# Patient Record
Sex: Female | Born: 1965 | Race: Black or African American | Hispanic: No | Marital: Single | Smoking: Current every day smoker
Health system: Southern US, Community
[De-identification: ages and names within clinical notes are randomized; demographics above are authoritative.]

## PROBLEM LIST (undated history)

## (undated) DIAGNOSIS — K219 Gastro-esophageal reflux disease without esophagitis: Secondary | ICD-10-CM

## (undated) DIAGNOSIS — B1920 Unspecified viral hepatitis C without hepatic coma: Secondary | ICD-10-CM

## (undated) DIAGNOSIS — K5900 Constipation, unspecified: Secondary | ICD-10-CM

## (undated) DIAGNOSIS — F329 Major depressive disorder, single episode, unspecified: Secondary | ICD-10-CM

## (undated) DIAGNOSIS — F32A Depression, unspecified: Secondary | ICD-10-CM

## (undated) DIAGNOSIS — Z720 Tobacco use: Secondary | ICD-10-CM

## (undated) DIAGNOSIS — M5430 Sciatica, unspecified side: Secondary | ICD-10-CM

## (undated) DIAGNOSIS — D509 Iron deficiency anemia, unspecified: Secondary | ICD-10-CM

## (undated) DIAGNOSIS — L209 Atopic dermatitis, unspecified: Secondary | ICD-10-CM

## (undated) HISTORY — DX: Major depressive disorder, single episode, unspecified: F32.9

## (undated) HISTORY — DX: Tobacco use: Z72.0

## (undated) HISTORY — DX: Depression, unspecified: F32.A

## (undated) HISTORY — DX: Iron deficiency anemia, unspecified: D50.9

## (undated) HISTORY — DX: Unspecified viral hepatitis C without hepatic coma: B19.20

## (undated) HISTORY — DX: Constipation, unspecified: K59.00

## (undated) HISTORY — DX: Gastro-esophageal reflux disease without esophagitis: K21.9

## (undated) HISTORY — DX: Sciatica, unspecified side: M54.30

## (undated) HISTORY — DX: Atopic dermatitis, unspecified: L20.9

---

## 2002-05-22 HISTORY — PX: OTHER SURGICAL HISTORY: SHX169

## 2008-01-09 ENCOUNTER — Ambulatory Visit: Payer: Self-pay | Admitting: Certified Nurse Midwife

## 2008-01-12 ENCOUNTER — Ambulatory Visit: Payer: Self-pay | Admitting: Certified Nurse Midwife

## 2008-05-03 ENCOUNTER — Emergency Department: Payer: Self-pay | Admitting: Emergency Medicine

## 2009-04-21 ENCOUNTER — Emergency Department: Payer: Self-pay | Admitting: Emergency Medicine

## 2009-10-28 ENCOUNTER — Other Ambulatory Visit: Payer: Self-pay | Admitting: Internal Medicine

## 2009-11-09 IMAGING — US ABDOMEN ULTRASOUND
1 series · 17 of 25 positions shown · non-contrast
Comparison: none

REASON FOR EXAM: abdominal pelvic pain
COMMENTS:

[Series 1: abdomen ultrasound · 17 of 57 slices shown]
[im 1/57]
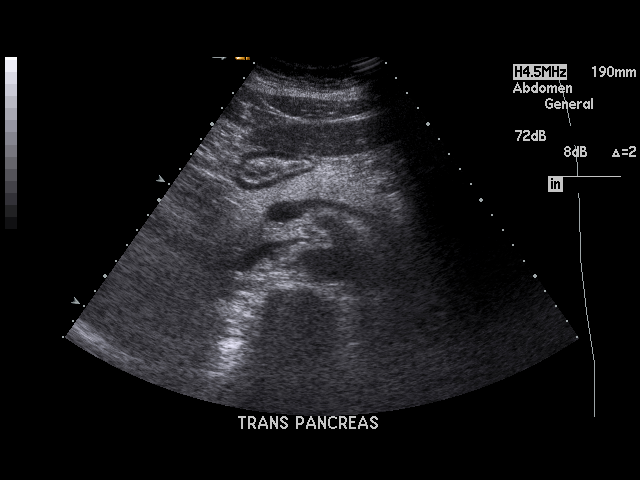
[im 5/57]
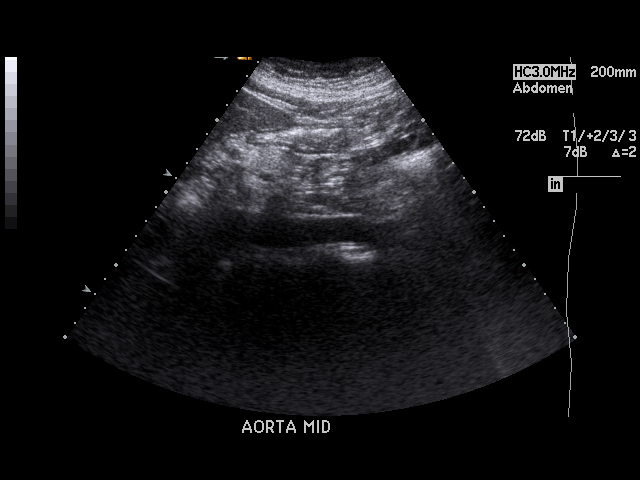
[im 8/57]
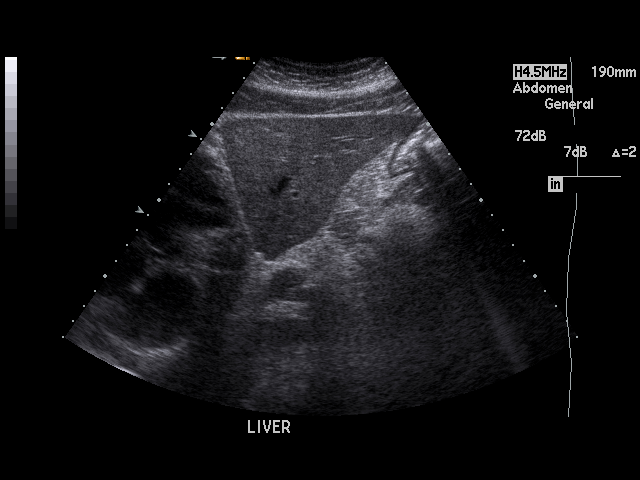
[im 12/57]
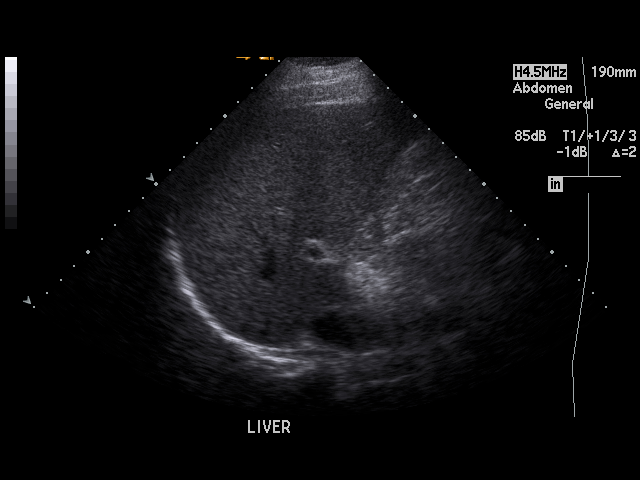
[im 15/57]
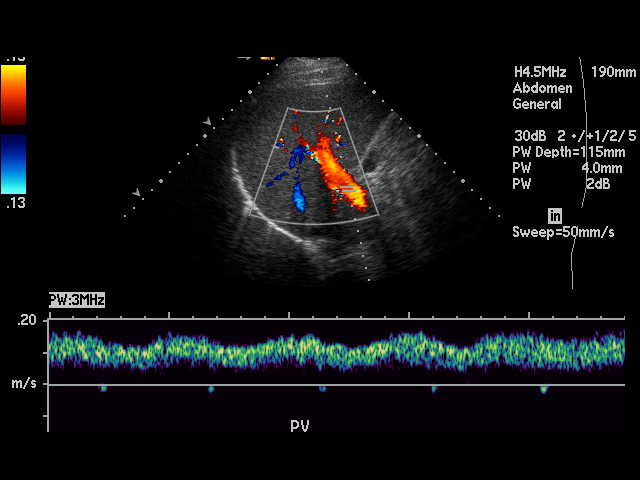
[im 19/57]
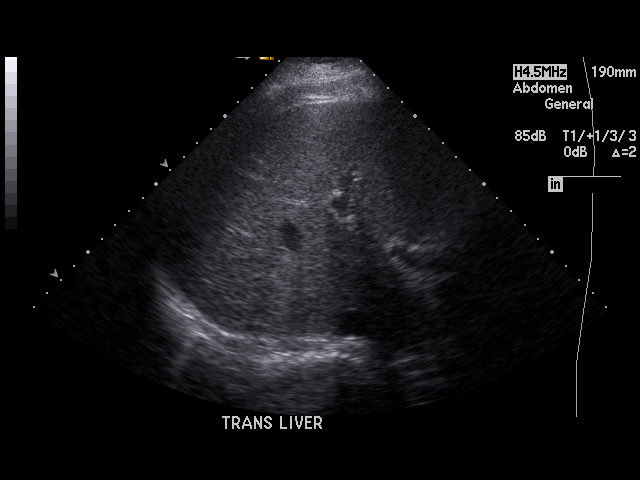
[im 22/57]
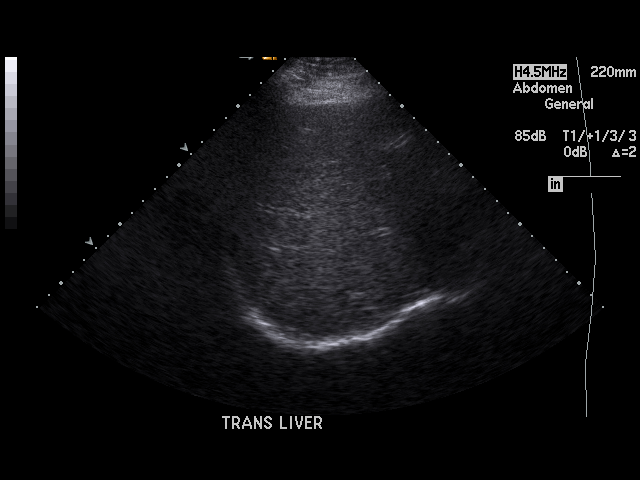
[im 26/57]
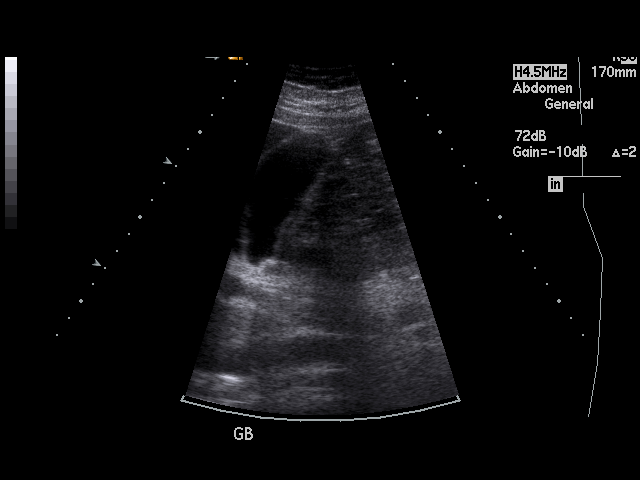
[im 29/57]
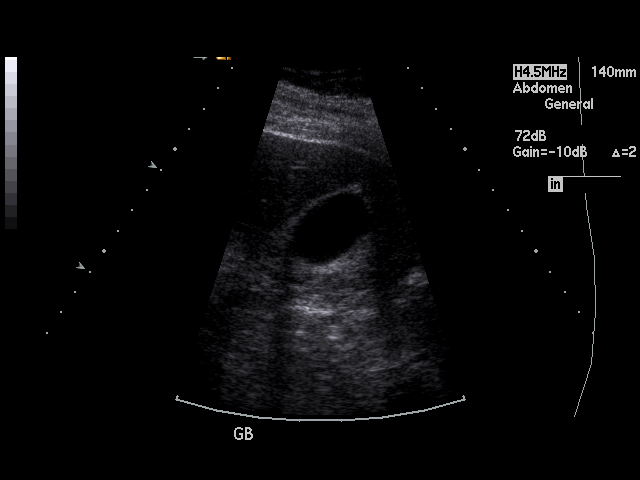
[im 31/57]
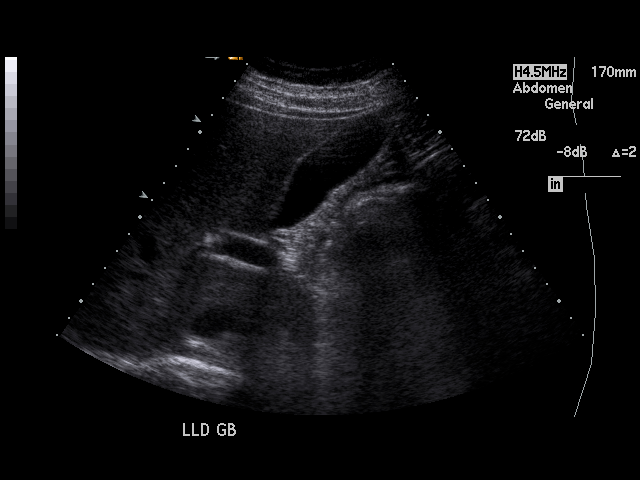
[im 36/57]
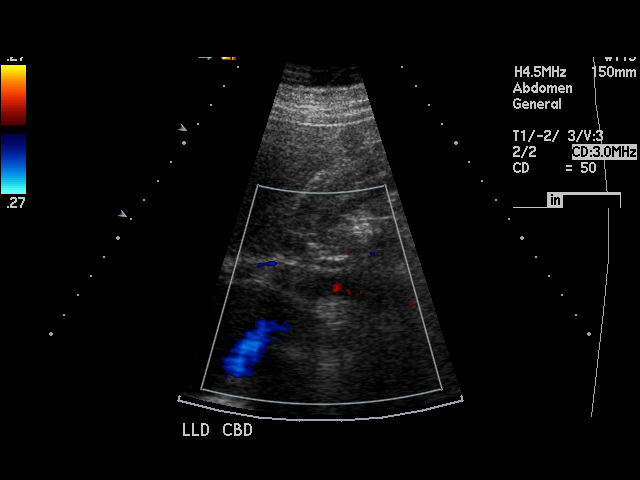
[im 38/57]
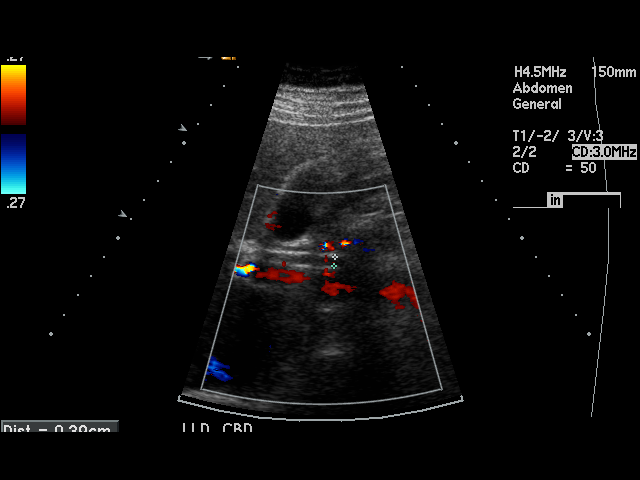
[im 43/57]
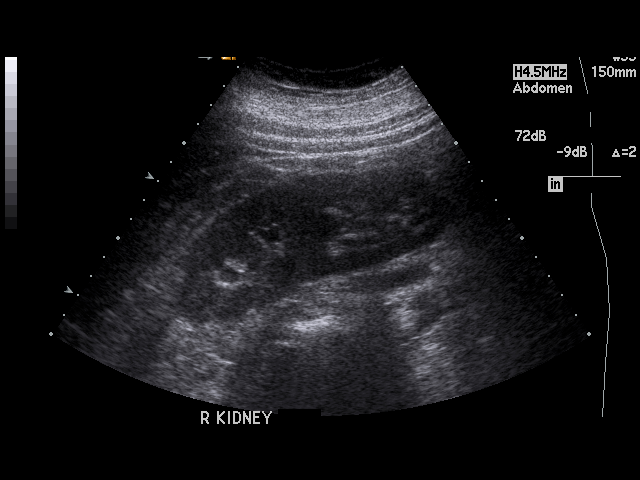
[im 45/57]
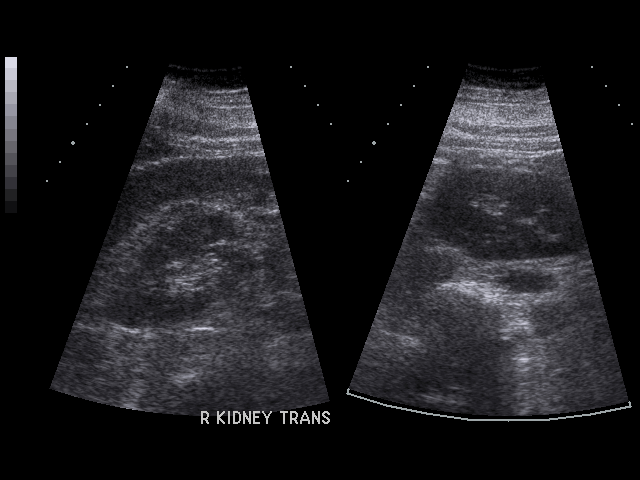
[im 50/57]
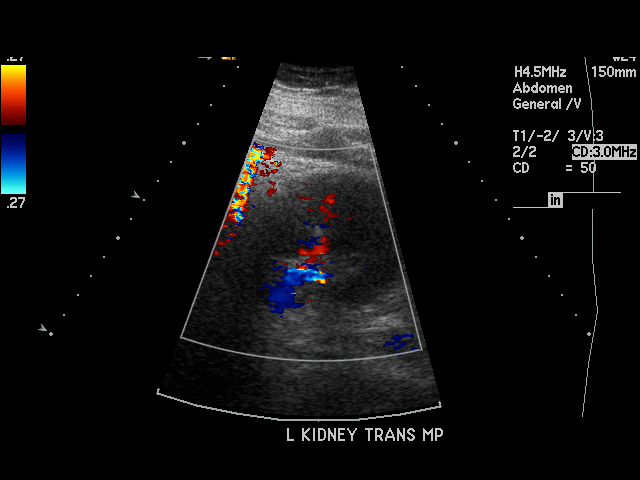
[im 52/57]
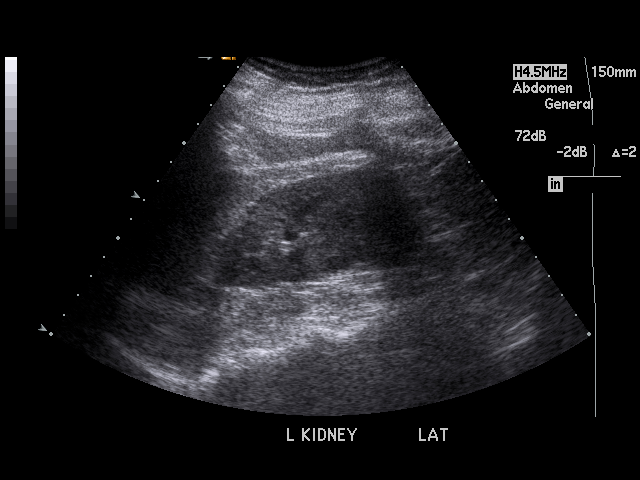
[im 57/57]
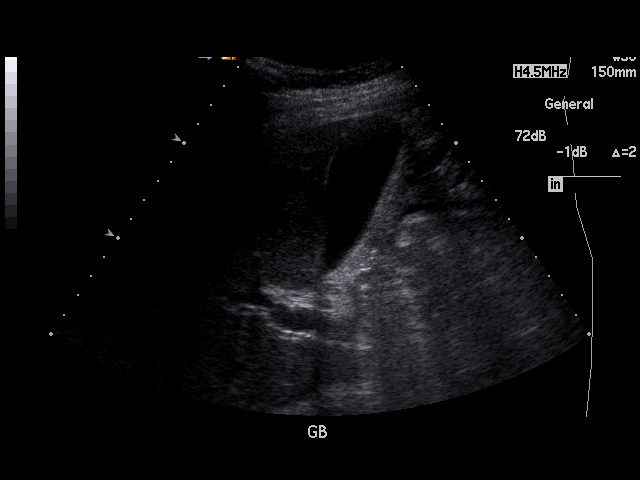

[17 of 25 positions shown; findings below may reference images not displayed]

PROCEDURE:     US  - US ABDOMEN GENERAL SURVEY  - January 12, 2008 [DATE]

RESULT:     The liver exhibits normal echotexture with no evidence of mass
or ductal dilation. Portal venous flow is normal in direction. The
gallbladder is adequately distended with no evidence of stones, wall
thickening, or pericholecystic fluid. The common bile duct is normal at
mm in diameter. There is limited evaluation of the pancreatic tail but
otherwise the pancreas appeared normal. The spleen, abdominal aorta, and
kidneys are normal in appearance. There is no evidence of ascites.
IMPRESSION: There is limited evaluation of the pancreatic tail.
Otherwise, the study is within the limits of normal.

## 2010-08-26 ENCOUNTER — Ambulatory Visit: Payer: Self-pay | Admitting: Family Medicine

## 2010-12-08 ENCOUNTER — Emergency Department: Payer: Self-pay | Admitting: Unknown Physician Specialty

## 2012-03-16 ENCOUNTER — Emergency Department: Payer: Self-pay | Admitting: Emergency Medicine

## 2014-02-28 ENCOUNTER — Emergency Department: Payer: Self-pay | Admitting: Emergency Medicine

## 2014-02-28 LAB — COMPREHENSIVE METABOLIC PANEL
ALBUMIN: 2.9 g/dL — AB (ref 3.4–5.0)
Alkaline Phosphatase: 94 U/L
Anion Gap: 9 (ref 7–16)
BUN: 13 mg/dL (ref 7–18)
Bilirubin,Total: 0.5 mg/dL (ref 0.2–1.0)
CHLORIDE: 111 mmol/L — AB (ref 98–107)
CO2: 21 mmol/L (ref 21–32)
CREATININE: 1.19 mg/dL (ref 0.60–1.30)
Calcium, Total: 9 mg/dL (ref 8.5–10.1)
EGFR (African American): >60
EGFR (Non-African Amer.): 54 — ABNORMAL LOW
Glucose: 148 mg/dL — ABNORMAL HIGH (ref 65–99)
Osmolality: 284 (ref 275–301)
Potassium: 3.7 mmol/L (ref 3.5–5.1)
SGOT(AST): 73 U/L — ABNORMAL HIGH (ref 15–37)
SGPT (ALT): 87 U/L — ABNORMAL HIGH
Sodium: 141 mmol/L (ref 136–145)
Total Protein: 7.9 g/dL (ref 6.4–8.2)

## 2014-02-28 LAB — CBC WITH DIFFERENTIAL/PLATELET
BASOS ABS: 0.1 10*3/uL (ref 0.0–0.1)
Basophil %: 0.8 %
EOS PCT: 1.4 %
Eosinophil #: 0.1 10*3/uL (ref 0.0–0.7)
HCT: 44.2 % (ref 35.0–47.0)
HGB: 14.3 g/dL (ref 12.0–16.0)
LYMPHS ABS: 1.8 10*3/uL (ref 1.0–3.6)
LYMPHS PCT: 26.2 %
MCH: 34.5 pg — AB (ref 26.0–34.0)
MCHC: 32.2 g/dL (ref 32.0–36.0)
MCV: 107 fL — ABNORMAL HIGH (ref 80–100)
MONO ABS: 0.3 x10 3/mm (ref 0.2–0.9)
Monocyte %: 4.7 %
NEUTROS ABS: 4.5 10*3/uL (ref 1.4–6.5)
Neutrophil %: 66.9 %
PLATELETS: 266 10*3/uL (ref 150–440)
RBC: 4.13 10*6/uL (ref 3.80–5.20)
RDW: 13.9 % (ref 11.5–14.5)
WBC: 6.7 10*3/uL (ref 3.6–11.0)

## 2014-02-28 LAB — HCG, QUANTITATIVE, PREGNANCY

## 2014-02-28 LAB — URINALYSIS, COMPLETE
BACTERIA: NONE SEEN
Bilirubin,UR: NEGATIVE
Blood: NEGATIVE
GLUCOSE, UR: NEGATIVE mg/dL (ref 0–75)
Hyaline Cast: 1
Ketone: NEGATIVE
Nitrite: NEGATIVE
PROTEIN: NEGATIVE
Ph: 5 (ref 4.5–8.0)
RBC,UR: 4 /HPF (ref 0–5)
SPECIFIC GRAVITY: 1.024 (ref 1.003–1.030)

## 2014-02-28 LAB — TROPONIN I

## 2014-02-28 LAB — LIPASE, BLOOD: Lipase: 379 U/L (ref 73–393)

## 2016-10-05 ENCOUNTER — Emergency Department
Admission: EM | Admit: 2016-10-05 | Discharge: 2016-10-05 | Disposition: A | Discharge status: Home / Self Care | Payer: Self-pay | Attending: Emergency Medicine | Admitting: Emergency Medicine

## 2016-10-05 DIAGNOSIS — Y929 Unspecified place or not applicable: Secondary | ICD-10-CM | POA: Insufficient documentation

## 2016-10-05 DIAGNOSIS — S91311A Laceration without foreign body, right foot, initial encounter: Secondary | ICD-10-CM | POA: Insufficient documentation

## 2016-10-05 DIAGNOSIS — Z23 Encounter for immunization: Secondary | ICD-10-CM | POA: Insufficient documentation

## 2016-10-05 DIAGNOSIS — W268XXA Contact with other sharp object(s), not elsewhere classified, initial encounter: Secondary | ICD-10-CM | POA: Insufficient documentation

## 2016-10-05 DIAGNOSIS — Y9389 Activity, other specified: Secondary | ICD-10-CM | POA: Insufficient documentation

## 2016-10-05 DIAGNOSIS — F17210 Nicotine dependence, cigarettes, uncomplicated: Secondary | ICD-10-CM | POA: Insufficient documentation

## 2016-10-05 DIAGNOSIS — Y999 Unspecified external cause status: Secondary | ICD-10-CM | POA: Insufficient documentation

## 2016-10-05 MED ORDER — TETANUS-DIPHTH-ACELL PERTUSSIS 5-2.5-18.5 LF-MCG/0.5 IM SUSP
0.5000 mL | Freq: Once | INTRAMUSCULAR | Status: AC
  Administered 2016-10-05: 0.5 mL via INTRAMUSCULAR
  Filled 2016-10-05: qty 0.5

## 2016-10-05 MED ORDER — KETOROLAC TROMETHAMINE 30 MG/ML IJ SOLN
30.0000 mg | Freq: Once | INTRAMUSCULAR | Status: AC
  Administered 2016-10-05: 30 mg via INTRAMUSCULAR

## 2016-10-05 MED ORDER — NAPROXEN 500 MG PO TBEC: 500.0000 mg | DELAYED_RELEASE_TABLET | Freq: Two times a day (BID) | ORAL | 0 refills | Status: AC

## 2016-10-05 MED ORDER — KETOROLAC TROMETHAMINE 30 MG/ML IJ SOLN
INTRAMUSCULAR | Status: AC
  Administered 2016-10-05: 30 mg via INTRAMUSCULAR
  Filled 2016-10-05: qty 1

## 2016-10-05 NOTE — ED Provider Notes (Signed)
[facility] Emergency Department Provider Note  ____________________________________________  Time seen: Approximately 3:38 PM  I have reviewed the triage vital signs and the nursing notes.   HISTORY  Chief Complaint Laceration    HPI SRITHA CHAUNCEY is a 51 y.o. female presents to the emergency department with a superficial laceration sustained to the plantar aspect of the first digit of the left lower extremity. Patient states that she sustained laceration after stepping on a metal fence posts last night while barefoot. Patient states that laceration bled initially. However, hemostasis has been achieved prior to discharge. Patient denies radiculopathy or weakness. No alleviating measures have attempted besides the application of a clean dressing.   History reviewed. No pertinent past medical history.  There are no active problems to display for this patient.   History reviewed. No pertinent surgical history.  Prior to Admission medications   Medication Sig Start Date End Date Taking? Authorizing Provider  naproxen (EC NAPROSYN) 500 MG EC tablet Take 1 tablet (500 mg total) by mouth 2 (two) times daily with a meal. 10/05/16 10/15/16  Orvil Feil, PA-C    Allergies Codeine  No family history on file.  Social History Social History  Substance Use Topics  . Smoking status: Current Every Day Smoker    Types: Cigarettes  . Smokeless tobacco: Never Used  . Alcohol use No     Review of Systems  Constitutional: No fever/chills Eyes: No visual changes. No discharge ENT: No upper respiratory complaints. Cardiovascular: no chest pain. Respiratory: no cough. No SOB. Gastrointestinal: No abdominal pain.  No nausea, no vomiting.  No diarrhea.  No constipation. Musculoskeletal: Negative for musculoskeletal pain. Skin: Patient has laceration at the plantar aspect of great toe, left.  Neurological: Negative for headaches, focal weakness or  numbness.  ____________________________________________   PHYSICAL EXAM:  VITAL SIGNS: ED Triage Vitals  Enc Vitals Group     BP 10/05/16 0907 (!) 141/99     Pulse Rate 10/05/16 0907 92     Resp 10/05/16 0907 18     Temp 10/05/16 0907 98 F (36.7 C)     Temp Source 10/05/16 0907 Oral     SpO2 10/05/16 0907 98 %     Weight 10/05/16 0908 250 lb (113.4 kg)     Height 10/05/16 0908  (1.753 m)     Head Circumference --      Peak Flow --      Pain Score 10/05/16 0906 10     Pain Loc --      Pain Edu? --      Excl. in GC? --      Constitutional: Alert and oriented. Well appearing and in no acute distress. Eyes: Conjunctivae are normal. PERRL. EOMI. Head: Atraumatic. Neck: No stridor.  No cervical spine tenderness to palpation. Cardiovascular: Normal rate, regular rhythm. Normal S1 and S2.  Good peripheral circulation. Respiratory: Normal respiratory effort without tachypnea or retractions. Lungs CTAB. Good air entry to the bases with no decreased or absent breath sounds. Musculoskeletal: Full range of motion to all extremities. No gross deformities appreciated. Neurologic:  Normal speech and language. No gross focal neurologic deficits are appreciated. Reflexes are 2+ and symmetric in the lower extremities bilaterally. Skin:  Patient had superficial laceration at the plantar aspect of the left great toe. Palpable dorsalis pedis pulse bilaterally and symmetrically. Psychiatric: Mood and affect are normal. Speech and behavior are normal. Patient exhibits appropriate insight and judgement.   ____________________________________________  LABS (all labs ordered are listed, but only abnormal results are displayed)  Labs Reviewed - No data to display ____________________________________________  EKG   ____________________________________________  RADIOLOGY   No results found.  ____________________________________________    PROCEDURES  Procedure(s) performed:     Procedures  LACERATION REPAIR Performed by: Orvil Feil Authorized by: Orvil Feil Consent: Verbal consent obtained. Risks and benefits: risks, benefits and alternatives were discussed Consent given by: patient Patient identity confirmed: provided demographic data Prepped and Draped in normal sterile fashion Wound explored  Laceration Location: Plantar aspect of great toe, left  Laceration Length: 1 cm  No Foreign Bodies seen or palpated  Irrigation method: syringe Amount of cleaning: standard  Skin closure: Dermabond  Patient tolerance: Patient tolerated the procedure well with no immediate complications.    Medications  Tdap (BOOSTRIX) injection 0.5 mL (0.5 mLs Intramuscular Given 10/05/16 1046)  ketorolac (TORADOL) 30 MG/ML injection 30 mg (30 mg Intramuscular Given 10/05/16 1044)     ____________________________________________   INITIAL IMPRESSION / ASSESSMENT AND PLAN / ED COURSE  Pertinent labs & imaging results that were available during my care of the patient were reviewed by me and considered in my medical decision making (see chart for details).  Review of the [place] CSRS was performed in accordance of the [facility] prior to dispensing any controlled drugs.     Assessment and plan: Laceration  Patient presents to the emergency department with a laceration of the skin overlying the plantar aspect of the left great toe. Patient underwent laceration repair with Dermabond. Patient tolerated procedure well. Patient was discharged with naproxen. Vital signs are reassuring aside from hypertension. All patient questions were answered.     ____________________________________________  FINAL CLINICAL IMPRESSION(S) / ED DIAGNOSES  Final diagnoses:  Laceration of right foot, initial encounter      NEW MEDICATIONS STARTED DURING THIS VISIT:  There are no discharge medications for this patient.       This chart was dictated using voice recognition  software/Dragon. Despite best efforts to proofread, errors can occur which can change the meaning. Any change was purely unintentional.    Orvil Feil, PA-C 10/05/16 1642    Rockne Menghini, MD 10/13/16 1902

## 2016-10-05 NOTE — ED Triage Notes (Signed)
Pt states she cut her left great toe on a fence post last night .Marland Kitchen

## 2017-03-11 ENCOUNTER — Emergency Department
Admission: EM | Admit: 2017-03-11 | Discharge: 2017-03-11 | Disposition: A | Discharge status: Home / Self Care | Payer: Self-pay | Attending: Emergency Medicine | Admitting: Emergency Medicine

## 2017-03-11 ENCOUNTER — Encounter: Payer: Self-pay | Admitting: Emergency Medicine

## 2017-03-11 DIAGNOSIS — H1089 Other conjunctivitis: Secondary | ICD-10-CM | POA: Insufficient documentation

## 2017-03-11 DIAGNOSIS — F17210 Nicotine dependence, cigarettes, uncomplicated: Secondary | ICD-10-CM | POA: Insufficient documentation

## 2017-03-11 DIAGNOSIS — H579 Unspecified disorder of eye and adnexa: Secondary | ICD-10-CM | POA: Insufficient documentation

## 2017-03-11 MED ORDER — FLUORESCEIN SODIUM 0.6 MG OP STRP
1.0000 | ORAL_STRIP | Freq: Once | OPHTHALMIC | Status: AC
  Administered 2017-03-11: 1 via OPHTHALMIC
  Filled 2017-03-11: qty 1

## 2017-03-11 MED ORDER — KETOROLAC TROMETHAMINE 0.5 % OP SOLN: 1.0000 [drp] | Freq: Four times a day (QID) | OPHTHALMIC | 0 refills | Status: DC

## 2017-03-11 MED ORDER — TETRACAINE HCL 0.5 % OP SOLN
2.0000 [drp] | Freq: Once | OPHTHALMIC | Status: AC
  Administered 2017-03-11: 2 [drp] via OPHTHALMIC
  Filled 2017-03-11: qty 4

## 2017-03-11 NOTE — ED Provider Notes (Signed)
[facility] Emergency Department Provider Note ____________________________________________  Time seen: 1048  I have reviewed the triage vital signs and the nursing notes.  HISTORY  Chief Complaint  Foreign Body in Eye  History and exam per Pollyann Glen, PA-S Sherrie Sport)  HPI [name] is a 51 y.o. female presents to the ED for evaluation A foreign body sensation to the right eye since last night. Patient describes sh was cleaning a fan, and a piece of debris fluid drop. She describes a sharp foreign body sensation, that she thought the glass. Since that time she's had right eye irritation and foreign body sensation to the area under the upper lid. She denies any vision change, nausea, or headache.   History reviewed. No pertinent past medical history.  There are no active problems to display for this patient.  History reviewed. No pertinent surgical history.  Prior to Admission medications   Medication Sig Start Date End Date Taking? Authorizing Provider  ketorolac (ACULAR) 0.5 % ophthalmic solution Place 1 drop into the right eye 4 (four) times daily. 03/11/17   Delories Mauri, Charlesetta Ivory, PA-C   Allergies Codeine  No family history on file.  Social History Social History  Substance Use Topics  . Smoking status: Current Every Day Smoker    Types: Cigarettes  . Smokeless tobacco: Never Used  . Alcohol use No    Review of Systems  Constitutional: Negative for fever. Eyes: Negative for visual changes. Right eye foreign body sensation as above.  ENT: Negative for sore throat. Gastrointestinal: Negative for abdominal pain, vomiting and diarrhea. Neurological: Negative for headaches, focal weakness or numbness. ____________________________________________  PHYSICAL EXAM:  VITAL SIGNS: ED Triage Vitals  Enc Vitals Group     BP 03/11/17 1031 123/64     Pulse Rate 03/11/17 1031 88     Resp 03/11/17 1031 20     Temp 03/11/17 1031 98.4 F (36.9  C)     Temp Source 03/11/17 1031 Oral     SpO2 03/11/17 1031 98 %     Weight 03/11/17 1031 250 lb (113.4 kg)     Height 03/11/17 1031  (1.753 m)     Head Circumference --      Peak Flow --      Pain Score 03/11/17 1014 8     Pain Loc --      Pain Edu? --      Excl. in GC? --     Constitutional: Alert and oriented. Well appearing and in no distress. Head: Normocephalic and atraumatic. Eyes: Conjunctivae are mildly injected on the right. PERRL. Normal extraocular movements. No fluorescin dye uptake to the right eye. Upper lid is everted and swept with a dampened, sterile swab. A brownish foreign body is removed.  Hematological/Lymphatic/Immunological: No preauricular lymphadenopathy. Cardiovascular: Normal distal pulses. Respiratory: Normal respiratory effort.  Musculoskeletal: Nontender with normal range of motion in all extremities.  Skin:  Skin is warm, dry and intact. No rash noted. ____________________________________________  PROCEDURES  Tetracaine iii gtts OD ____________________________________________  INITIAL IMPRESSION / ASSESSMENT AND PLAN / ED COURSE  Patient with right eye FBS. She is found to have a retained foreign body to the upper lid. She reports resolution of the FBS after exam. She is discharged with a prescription for Acular ophthalmic solution to use PRN. She may also use OTC rewetting drops as needed. She is referred to [facility] for follow-up as needed.  ____________________________________________  FINAL CLINICAL IMPRESSION(S) / ED DIAGNOSES  Final diagnoses:  Sensation of foreign body in eye  Other conjunctivitis of right eye      Lissa Hoard, PA-C 03/11/17 1159    Sharman Cheek, MD 03/11/17 1546

## 2017-03-11 NOTE — Discharge Instructions (Signed)
You have had a retained foreign body removed from your upper lid. Use OTC rewetting or lubricating eye drops for comfort. Consider using the prescription eye drops as needed for redness and irritation. Follow-up with [facility] as needed.

## 2017-03-11 NOTE — ED Triage Notes (Signed)
Says got glass in eye last night

## 2017-03-11 NOTE — ED Notes (Addendum)
See triage note  States she was cleaning a ceiling fan and something fell into right eye   States she had blurred vision last pm but denies any this am   conts to have some pain  Describes pain as burning and itchy

## 2017-03-11 NOTE — ED Notes (Signed)
Pt alert and oriented X4, active, cooperative, pt in NAD. RR even and unlabored, color WNL.  Pt informed to return if any life threatening symptoms occur.  Discharge and followup instructions reviewed.  

## 2018-01-25 ENCOUNTER — Emergency Department: Payer: Self-pay

## 2018-01-25 ENCOUNTER — Other Ambulatory Visit: Payer: Self-pay

## 2018-01-25 ENCOUNTER — Emergency Department
Admission: EM | Admit: 2018-01-25 | Discharge: 2018-01-25 | Disposition: A | Discharge status: Home / Self Care | Payer: Self-pay | Attending: Emergency Medicine | Admitting: Emergency Medicine

## 2018-01-25 ENCOUNTER — Encounter: Payer: Self-pay | Admitting: Emergency Medicine

## 2018-01-25 DIAGNOSIS — F17210 Nicotine dependence, cigarettes, uncomplicated: Secondary | ICD-10-CM | POA: Insufficient documentation

## 2018-01-25 DIAGNOSIS — M5441 Lumbago with sciatica, right side: Secondary | ICD-10-CM | POA: Insufficient documentation

## 2018-01-25 MED ORDER — KETOROLAC TROMETHAMINE 30 MG/ML IJ SOLN
30.0000 mg | Freq: Once | INTRAMUSCULAR | Status: AC
  Administered 2018-01-25: 30 mg via INTRAMUSCULAR
  Filled 2018-01-25: qty 1

## 2018-01-25 MED ORDER — PREDNISONE 10 MG PO TABS: ORAL_TABLET | ORAL | 0 refills | Status: DC

## 2018-01-25 NOTE — ED Provider Notes (Signed)
[facility] Emergency Department Provider Note  ____________________________________________   First MD Initiated Contact with Patient 01/25/18 1235     (approximate)  I have reviewed the triage vital signs and the nursing notes.   HISTORY  Chief Complaint Back Pain  HPI Morgan Hicks is a 52 y.o. female is here with complaint of low back pain for 1 week.  Patient states the pain is now radiating from her low back into her right leg.  She states it stops into the area of her hip.  Patient has continued to ambulate without assistance.  She has been taking Tylenol at home without any relief.  She denies any previous problems with her back.  She also denies any urinary symptoms or history of kidney stones.  Patient denies any incontinence of bowel or bladder or saddle anesthesias.  Patient states she drove herself to the ED.  History reviewed. No pertinent past medical history.  There are no active problems to display for this patient.   History reviewed. No pertinent surgical history.  Prior to Admission medications   Medication Sig Start Date End Date Taking? Authorizing Provider  predniSONE (DELTASONE) 10 MG tablet Take 6 tablets  today, on day 2 take 5 tablets, day 3 take 4 tablets, day 4 take 3 tablets, day 5 take  2 tablets and 1 tablet the last day 01/25/18   Tommi Rumps, PA-C    Allergies Codeine  No family history on file.  Social History Social History   Tobacco Use  . Smoking status: Current Every Day Smoker    Types: Cigarettes  . Smokeless tobacco: Never Used  Substance Use Topics  . Alcohol use: No  . Drug use: Not on file    Review of Systems Constitutional: No fever/chills Cardiovascular: Denies chest pain. Respiratory: Denies shortness of breath. Gastrointestinal: No abdominal pain.  No nausea, no vomiting.  Genitourinary: Negative for dysuria.  Negative for kidney stones. Musculoskeletal: Positive for low back pain.   Positive right leg radiculopathy. Skin: Negative for rash. Neurological: Negative for  focal weakness or numbness. ___________________________________________   PHYSICAL EXAM:  VITAL SIGNS: ED Triage Vitals  Enc Vitals Group     BP      Pulse      Resp      Temp      Temp src      SpO2      Weight      Height      Head Circumference      Peak Flow      Pain Score      Pain Loc      Pain Edu?      Excl. in GC?    Constitutional: Alert and oriented. Well appearing and in no acute distress. Eyes: Conjunctivae are normal.  Head: Atraumatic. Neck: No stridor.   Cardiovascular: Normal rate, regular rhythm. Grossly normal heart sounds.  Good peripheral circulation. Respiratory: Normal respiratory effort.  No retractions. Lungs CTAB. Gastrointestinal: Soft and nontender. No distention. No CVA tenderness. Musculoskeletal:  Neurologic:  Normal speech and language. No gross focal neurologic deficits are appreciated. No gait instability. Skin:  Skin is warm, dry and intact. No rash noted. Psychiatric: Mood and affect are normal. Speech and behavior are normal.  ____________________________________________   LABS (all labs ordered are listed, but only abnormal results are displayed)  Labs Reviewed - No data to display  RADIOLOGY  ED MD interpretation:   Lumbar spine shows degenerative changes.  Official radiology report(s): Dg Lumbar Spine 2-3 Views  Result Date: 01/25/2018 CLINICAL DATA:  52 year old female with a history of low back pain EXAM: LUMBAR SPINE - 2-3 VIEW COMPARISON:  None. FINDINGS: Lumbar Spine: Lumbar vertebral elements maintain normal alignment without evidence of anterolisthesis, retrolisthesis, subluxation. No acute fracture line identified. Vertebral body heights maintained. Disc space narrowing at L4-L5 and L5-S1. Endplate changes are most pronounced at these levels with sclerosis. Facet hypertrophy at L4-L5 and L5-S1. Pelvic phleboliths IMPRESSION: No  plain film evidence of acute fracture malalignment of the lumbar spine. Degenerative disc disease and facet disease at L4-L5 and L5-S1 Electronically Signed   By: Gilmer MorJaime  Wagner D.O.   On: 01/25/2018 13:30    ____________________________________________   PROCEDURES  Procedure(s) performed: None  Procedures  Critical Care performed: No  ____________________________________________   INITIAL IMPRESSION / ASSESSMENT AND PLAN / ED COURSE  As part of my medical decision making, I reviewed the following data within the electronic MEDICAL RECORD NUMBER Notes from prior ED visits and [place] Controlled Substance Database  Patient was given Toradol 30 mg IM while waiting for x-ray and results.  LS-spine x-rays were reviewed with patient and she was reassured that there were degenerative changes which most likely is causing her sciatic type presentation.  Patient will begin on prednisone 60 mg 6-day taper.  She is to follow-up with her primary care provider if any continued problems in 1 to 2 weeks.  She may also take Tylenol if needed for pain while taking the prednisone.  ____________________________________________   FINAL CLINICAL IMPRESSION(S) / ED DIAGNOSES  Final diagnoses:  Acute right-sided low back pain with right-sided sciatica     ED Discharge Orders        Ordered    predniSONE (DELTASONE) 10 MG tablet     01/25/18 1346       Note:  This document was prepared using Dragon voice recognition software and may include unintentional dictation errors.    Tommi RumpsSummers, Olivier Frayre L, PA-C 01/25/18 1442    Merrily Brittleifenbark, Neil, MD 01/25/18 1524

## 2018-01-25 NOTE — Discharge Instructions (Addendum)
Begin taking prednisone as directed.  Today you will take 6 tablets and taper down by 1 tablet each day as directed.  Take tablets approximately the same time each day.  Follow-up with your primary care provider.  Call make an appointment for follow-up in 1 to 2 weeks.  You may also take Tylenol while taking the prednisone.  After finishing the prednisone take anti-inflammatory such as Aleve or ibuprofen.

## 2018-01-25 NOTE — ED Triage Notes (Signed)
Presents with lower back pain for 1 week  Stats pain is moving into right leg  Ambulates well   No limp note  Denies any injury or urinary sxs;

## 2018-02-24 ENCOUNTER — Other Ambulatory Visit: Payer: Self-pay

## 2018-02-24 ENCOUNTER — Inpatient Hospital Stay: Payer: Self-pay

## 2018-02-24 ENCOUNTER — Encounter: Payer: Self-pay | Admitting: Internal Medicine

## 2018-02-24 ENCOUNTER — Inpatient Hospital Stay: Payer: Self-pay | Attending: Internal Medicine | Admitting: Internal Medicine

## 2018-02-24 VITALS — BP 158/70 | HR 101 | Temp 97.6°F | Resp 18 | Ht 69.0 in | Wt 241.0 lb

## 2018-02-24 DIAGNOSIS — D509 Iron deficiency anemia, unspecified: Secondary | ICD-10-CM

## 2018-02-24 DIAGNOSIS — M48061 Spinal stenosis, lumbar region without neurogenic claudication: Secondary | ICD-10-CM | POA: Insufficient documentation

## 2018-02-24 DIAGNOSIS — R0602 Shortness of breath: Secondary | ICD-10-CM | POA: Insufficient documentation

## 2018-02-24 DIAGNOSIS — B1920 Unspecified viral hepatitis C without hepatic coma: Secondary | ICD-10-CM | POA: Insufficient documentation

## 2018-02-24 DIAGNOSIS — D649 Anemia, unspecified: Secondary | ICD-10-CM

## 2018-02-24 DIAGNOSIS — K219 Gastro-esophageal reflux disease without esophagitis: Secondary | ICD-10-CM | POA: Insufficient documentation

## 2018-02-24 DIAGNOSIS — F329 Major depressive disorder, single episode, unspecified: Secondary | ICD-10-CM | POA: Insufficient documentation

## 2018-02-24 DIAGNOSIS — M5136 Other intervertebral disc degeneration, lumbar region: Secondary | ICD-10-CM | POA: Insufficient documentation

## 2018-02-24 DIAGNOSIS — F17210 Nicotine dependence, cigarettes, uncomplicated: Secondary | ICD-10-CM | POA: Insufficient documentation

## 2018-02-24 DIAGNOSIS — Z79899 Other long term (current) drug therapy: Secondary | ICD-10-CM | POA: Insufficient documentation

## 2018-02-24 LAB — C-REACTIVE PROTEIN: CRP: <0.8 mg/dL (ref <1.0)

## 2018-02-24 LAB — TECHNOLOGIST SMEAR REVIEW

## 2018-02-24 LAB — CBC WITH DIFFERENTIAL/PLATELET
BASOS PCT: 1 %
Basophils Absolute: 0.1 10*3/uL (ref 0–0.1)
EOS PCT: 4 %
Eosinophils Absolute: 0.2 10*3/uL (ref 0–0.7)
HCT: 27.8 % — ABNORMAL LOW (ref 35.0–47.0)
HEMOGLOBIN: 8.3 g/dL — AB (ref 12.0–16.0)
LYMPHS ABS: 1.8 10*3/uL (ref 1.0–3.6)
Lymphocytes Relative: 31 %
MCH: 25.9 pg — AB (ref 26.0–34.0)
MCHC: 29.7 g/dL — AB (ref 32.0–36.0)
MCV: 87 fL (ref 80.0–100.0)
MONO ABS: 0.3 10*3/uL (ref 0.2–0.9)
Monocytes Relative: 6 %
NEUTROS ABS: 3.3 10*3/uL (ref 1.4–6.5)
Neutrophils Relative %: 58 %
Platelets: 266 10*3/uL (ref 150–440)
RBC: 3.19 MIL/uL — ABNORMAL LOW (ref 3.80–5.20)
RDW: 21.7 % — ABNORMAL HIGH (ref 11.5–14.5)
WBC: 5.7 10*3/uL (ref 4.0–10.5)

## 2018-02-24 LAB — IRON AND TIBC
Iron: 439 ug/dL — ABNORMAL HIGH (ref 28–170)
SATURATION RATIOS: 91 % — AB (ref 10.4–31.8)
TIBC: 482 ug/dL — AB (ref 250–450)
UIBC: 43 ug/dL

## 2018-02-24 LAB — LACTATE DEHYDROGENASE: LDH: 172 U/L (ref 98–192)

## 2018-02-24 LAB — RETICULOCYTES
RBC.: 3.16 MIL/uL — AB (ref 3.80–5.20)
RETIC COUNT ABSOLUTE: 211.7 10*3/uL — AB (ref 19.0–183.0)
Retic Ct Pct: 6.7 % — ABNORMAL HIGH (ref 0.4–3.1)

## 2018-02-24 NOTE — Assessment & Plan Note (Addendum)
#  Anemia hemoglobin 7; MCV 80.  Prior hemoglobin in 2017 within normal limits.  #Etiology of anemia is unclear recommend CBC C-reactive protein haptoglobin iron studies; stool occult blood x2 LDH; myeloma panel kappa lambda light chain.  CRP.  Review of peripheral smear.  #Bilateral axillary adenopathy-we will recommend mammogram at next visit.  #Shortness of breath and exertion/smoker-.  Chest x-ray  #Smoker discussed regarding smoking cessation.   #Follow-up in 1 to 2 weeks to review the above work up.  Thank you Dr.Patel  for allowing me to participate in the care of your pleasant patient. Please do not hesitate to contact me with questions or concerns in the interim.

## 2018-02-24 NOTE — Progress Notes (Signed)
[facility] CONSULT NOTE  Patient Care Team: Hillery Aldo, MD as PCP - General (Family Medicine)  CHIEF COMPLAINTS/PURPOSE OF CONSULTATION:  Anemia  # AUG 2019-anemia MCV 80; electrophoresis-normal/PCP; ferritin 15; creat 0.88; LFTs-N  #Hepatitis C positive [HCV >11]/ B-12/ folate/   No history exists.     HISTORY OF PRESENTING ILLNESS:  Morgan Hicks 52 y.o.  female with no significant past medical history except for untreated hep C has been referred to [place] for new diagnosis of microcytic anemia.  Patient had a recent work-up with her PCP that showed hemoglobin of 7; MCV 80; hepatitis C titers positive; HIV negative; hepatitis B screen negative.  LFTs within normal limits.  Patient complains of mild to moderate fatigue.  Denies any blood in stools black or stools denies blood in urine.  Denies any vaginal bleeding.  Patient had remote history of anemia as a teenager.  Patient never had colonoscopy.  Patient has chronic back pain/myalgias.  10 pounds weight loss in the last 3 months.  No cough.  Mild shortness of breath on exertion.  Review of Systems  Constitutional: Positive for malaise/fatigue and weight loss. Negative for chills, diaphoresis and fever.  HENT: Negative for nosebleeds and sore throat.   Eyes: Negative for double vision.  Respiratory: Positive for shortness of breath. Negative for cough, hemoptysis, sputum production and wheezing.   Cardiovascular: Negative for chest pain, palpitations, orthopnea and leg swelling.  Gastrointestinal: Negative for abdominal pain, blood in stool, constipation, diarrhea, heartburn, melena, nausea and vomiting.  Genitourinary: Negative for dysuria, frequency and urgency.  Musculoskeletal: Positive for back pain and myalgias. Negative for joint pain.  Skin: Negative.  Negative for itching and rash.  Neurological: Negative for dizziness, tingling, focal weakness, weakness and headaches.  Endo/Heme/Allergies: Does not  bruise/bleed easily.  Psychiatric/Behavioral: Negative for depression. The patient is not nervous/anxious and does not have insomnia.      MEDICAL HISTORY:  Past Medical History:  Diagnosis Date  . Atopic dermatitis   . Constipation   . Depression   . GERD (gastroesophageal reflux disease)   . Hepatitis C   . Sciatica   . Tobacco use     SURGICAL HISTORY: Past Surgical History:  Procedure Laterality Date  . paraguard IUD surgery  05/22/2002    SOCIAL HISTORY: Social History   Socioeconomic History  . Marital status: Single    Spouse name: Not on file  . Number of children: Not on file  . Years of education: Not on file  . Highest education level: Not on file  Occupational History  . Not on file  Social Needs  . Financial resource strain: Not on file  . Food insecurity:    Worry: Not on file    Inability: Not on file  . Transportation needs:    Medical: Not on file    Non-medical: Not on file  Tobacco Use  . Smoking status: Current Every Day Smoker    Years: 10.00    Types: Cigarettes  . Smokeless tobacco: Never Used  . Tobacco comment: 5 cigerattes per day  Substance and Sexual Activity  . Alcohol use: No  . Drug use: Not on file  . Sexual activity: Not on file  Lifestyle  . Physical activity:    Days per week: Not on file    Minutes per session: Not on file  . Stress: Not on file  Relationships  . Social connections:    Talks on phone: Not on file  Gets together: Not on file    Attends religious service: Not on file    Active member of club or organization: Not on file    Attends meetings of clubs or organizations: Not on file    Relationship status: Not on file  . Intimate partner violence:    Fear of current or ex partner: Not on file    Emotionally abused: Not on file    Physically abused: Not on file    Forced sexual activity: Not on file  Other Topics Concern  . Not on file  Social History Narrative   In health care; alochol/beer every  other day; smoke 5 cig/day; in [place]    FAMILY HISTORY: Family History  Problem Relation Age of Onset  . Stomach cancer Mother 21  . Cirrhosis Father   . Breast cancer Sister 27  . Throat cancer Other     ALLERGIES:  is allergic to codeine and percocet [oxycodone-acetaminophen].  MEDICATIONS:  Current Outpatient Medications  Medication Sig Dispense Refill  . Alum & Mag Hydroxide-Simeth (MAALOX ADVANCED PO) Take 1 tablet by mouth 3 times/day as needed-between meals & bedtime (gi distress).     . cyclobenzaprine (FLEXERIL) 10 MG tablet Take 10 mg by mouth at bedtime.    . ferrous sulfate 325 (65 FE) MG tablet Take 325 mg by mouth 3 (three) times daily with meals.    . naproxen (NAPROSYN) 500 MG tablet Take 500 mg by mouth daily.     No current facility-administered medications for this visit.       Marland Kitchen  PHYSICAL EXAMINATION: ECOG PERFORMANCE STATUS: 1 - Symptomatic but completely ambulatory  Vitals:   02/24/18 1119  BP: (!) 158/70  Pulse: (!) 101  Resp: 18  Temp: 97.6 F (36.4 C)   Filed Weights   02/24/18 1119  Weight: 241 lb (109.3 kg)    Physical Exam  Constitutional: She is oriented to person, place, and time and well-developed, well-nourished, and in no distress.  HENT:  Head: Normocephalic and atraumatic.  Mouth/Throat: Oropharynx is clear and moist. No oropharyngeal exudate.  Eyes: Pupils are equal, round, and reactive to light.  Neck: Normal range of motion. Neck supple.  Cardiovascular: Normal rate and regular rhythm.  Pulmonary/Chest: No respiratory distress. She has no wheezes.  Abdominal: Soft. Bowel sounds are normal. She exhibits no distension and no mass. There is no tenderness. There is no rebound and no guarding.  Musculoskeletal: Normal range of motion. She exhibits no edema or tenderness.  Lymphadenopathy:  Bilateral left more than right up to 1 cm lymph node felt.  Rubbery.  Neurological: She is alert and oriented to person, place, and  time.  Skin: Skin is warm.  Psychiatric: Affect normal.     LABORATORY DATA:  I have reviewed the data as listed Lab Results  Component Value Date   WBC PENDING 02/24/2018   HGB 8.3 (L) 02/24/2018   HCT 27.8 (L) 02/24/2018   MCV 87.0 02/24/2018   PLT 266 02/24/2018   No results for input(s): NA, K, CL, CO2, GLUCOSE, BUN, CREATININE, CALCIUM, GFRNONAA, GFRAA, PROT, ALBUMIN, AST, ALT, ALKPHOS, BILITOT, BILIDIR, IBILI in the last 8760 hours.  RADIOGRAPHIC STUDIES: I have personally reviewed the radiological images as listed and agreed with the findings in the report. Dg Lumbar Spine 2-3 Views  Result Date: 01/25/2018 CLINICAL DATA:  52 year old female with a history of low back pain EXAM: LUMBAR SPINE - 2-3 VIEW COMPARISON:  None. FINDINGS: Lumbar Spine: Lumbar vertebral elements  maintain normal alignment without evidence of anterolisthesis, retrolisthesis, subluxation. No acute fracture line identified. Vertebral body heights maintained. Disc space narrowing at L4-L5 and L5-S1. Endplate changes are most pronounced at these levels with sclerosis. Facet hypertrophy at L4-L5 and L5-S1. Pelvic phleboliths IMPRESSION: No plain film evidence of acute fracture malalignment of the lumbar spine. Degenerative disc disease and facet disease at L4-L5 and L5-S1 Electronically Signed   By: Gilmer Mor D.O.   On: 01/25/2018 13:30    ASSESSMENT & PLAN:   Microcytic anemia #Anemia hemoglobin 7; MCV 80.  Prior hemoglobin in 2017 within normal limits.  #Etiology of anemia is unclear recommend CBC C-reactive protein haptoglobin iron studies; stool occult blood x2 LDH; myeloma panel kappa lambda light chain.  CRP.  Review of peripheral smear.  #Bilateral axillary adenopathy-we will recommend mammogram at next visit.  #Shortness of breath and exertion/smoker-.  Chest x-ray  #Smoker discussed regarding smoking cessation.   #Follow-up in 1 to 2 weeks to review the above work up.  Thank you Dr.Patel   for allowing me to participate in the care of your pleasant patient. Please do not hesitate to contact me with questions or concerns in the interim.   All questions were answered. The patient knows to call the clinic with any problems, questions or concerns.    Earna Coder, MD 02/24/2018 1:26 PM

## 2018-02-25 LAB — HAPTOGLOBIN: HAPTOGLOBIN: 152 mg/dL (ref 34–200)

## 2018-02-27 LAB — MULTIPLE MYELOMA PANEL, SERUM
ALBUMIN SERPL ELPH-MCNC: 3.4 g/dL (ref 2.9–4.4)
Albumin/Glob SerPl: 0.9 (ref 0.7–1.7)
Alpha 1: 0.3 g/dL (ref 0.0–0.4)
Alpha2 Glob SerPl Elph-Mcnc: 0.7 g/dL (ref 0.4–1.0)
B-GLOBULIN SERPL ELPH-MCNC: 1.1 g/dL (ref 0.7–1.3)
Gamma Glob SerPl Elph-Mcnc: 2 g/dL — ABNORMAL HIGH (ref 0.4–1.8)
Globulin, Total: 4.1 g/dL — ABNORMAL HIGH (ref 2.2–3.9)
IGA: 132 mg/dL (ref 87–352)
IgG (Immunoglobin G), Serum: 2148 mg/dL — ABNORMAL HIGH (ref 700–1600)
IgM (Immunoglobulin M), Srm: 87 mg/dL (ref 26–217)
TOTAL PROTEIN ELP: 7.5 g/dL (ref 6.0–8.5)

## 2018-02-27 LAB — KAPPA/LAMBDA LIGHT CHAINS
KAPPA FREE LGHT CHN: 55.5 mg/L — AB (ref 3.3–19.4)
Kappa, lambda light chain ratio: 2.35 — ABNORMAL HIGH (ref 0.26–1.65)
LAMDA FREE LIGHT CHAINS: 23.6 mg/L (ref 5.7–26.3)

## 2018-03-10 ENCOUNTER — Inpatient Hospital Stay (HOSPITAL_BASED_OUTPATIENT_CLINIC_OR_DEPARTMENT_OTHER): Payer: Self-pay | Admitting: Internal Medicine

## 2018-03-10 ENCOUNTER — Other Ambulatory Visit: Payer: Self-pay

## 2018-03-10 VITALS — BP 143/79 | HR 92 | Temp 96.3°F | Resp 20 | Ht 69.0 in | Wt 239.0 lb

## 2018-03-10 DIAGNOSIS — D509 Iron deficiency anemia, unspecified: Secondary | ICD-10-CM

## 2018-03-10 DIAGNOSIS — B1920 Unspecified viral hepatitis C without hepatic coma: Secondary | ICD-10-CM

## 2018-03-10 DIAGNOSIS — F17210 Nicotine dependence, cigarettes, uncomplicated: Secondary | ICD-10-CM

## 2018-03-10 DIAGNOSIS — Z79899 Other long term (current) drug therapy: Secondary | ICD-10-CM

## 2018-03-10 DIAGNOSIS — R0602 Shortness of breath: Secondary | ICD-10-CM

## 2018-03-10 NOTE — Progress Notes (Signed)
[name] CONSULT NOTE  Patient Care Team: Hillery AldoPatel, Sarah, MD as PCP - General (Family Medicine)  CHIEF COMPLAINTS/PURPOSE OF CONSULTATION: Anemia  # AUG 2019-anemia 7-8;  MCV 80; electrophoresis-normal/PCP; ferritin 15; creat 0.88; LFTs-N; LDH- N;   #Hepatitis C positive [HCV >11]/ B-12/ folate/   #Active smoker.  No history exists.     HISTORY OF PRESENTING ILLNESS:  Morgan Hicks 52 y.o.  female with no significant past medical history except for untreated hep C has been referred to us for new diagnosis of microcytic anemia.  Patient complains of mild fatigue.  Otherwise denies any blood in stools black or stools.  Denies any vaginal bleeding.  She is awaiting hematology evaluation-to proceed with treatment for her hepatitis C.  Patient has chronic back pain/myalgias.  10 pounds weight loss in the last 3 months.  No cough.  Mild shortness of breath on exertion.  Review of Systems  Constitutional: Positive for malaise/fatigue and weight loss. Negative for chills, diaphoresis and fever.  HENT: Negative for nosebleeds and sore throat.   Eyes: Negative for double vision.  Respiratory: Positive for shortness of breath. Negative for cough, hemoptysis, sputum production and wheezing.   Cardiovascular: Negative for chest pain, palpitations, orthopnea and leg swelling.  Gastrointestinal: Negative for abdominal pain, blood in stool, constipation, diarrhea, heartburn, melena, nausea and vomiting.  Genitourinary: Negative for dysuria, frequency and urgency.  Musculoskeletal: Positive for back pain and myalgias. Negative for joint pain.  Skin: Negative.  Negative for itching and rash.  Neurological: Negative for dizziness, tingling, focal weakness, weakness and headaches.  Endo/Heme/Allergies: Does not bruise/bleed easily.  Psychiatric/Behavioral: Negative for depression. The patient is not nervous/anxious and does not have insomnia.      MEDICAL HISTORY:  Past Medical  History:  Diagnosis Date  . Atopic dermatitis   . Constipation   . Depression   . GERD (gastroesophageal reflux disease)   . Hepatitis C   . Sciatica   . Tobacco use     SURGICAL HISTORY: Past Surgical History:  Procedure Laterality Date  . paraguard IUD surgery  05/22/2002    SOCIAL HISTORY: Social History   Socioeconomic History  . Marital status: Single    Spouse name: Not on file  . Number of children: Not on file  . Years of education: Not on file  . Highest education level: Not on file  Occupational History  . Not on file  Social Needs  . Financial resource strain: Not on file  . Food insecurity:    Worry: Not on file    Inability: Not on file  . Transportation needs:    Medical: Not on file    Non-medical: Not on file  Tobacco Use  . Smoking status: Current Every Day Smoker    Years: 10.00    Types: Cigarettes  . Smokeless tobacco: Never Used  . Tobacco comment: 5 cigerattes per day  Substance and Sexual Activity  . Alcohol use: No  . Drug use: Not on file  . Sexual activity: Not on file  Lifestyle  . Physical activity:    Days per week: Not on file    Minutes per session: Not on file  . Stress: Not on file  Relationships  . Social connections:    Talks on phone: Not on file    Gets together: Not on file    Attends religious service: Not on file    Active member of club or organization: Not on file  Attends meetings of clubs or organizations: Not on file    Relationship status: Not on file  . Intimate partner violence:    Fear of current or ex partner: Not on file    Emotionally abused: Not on file    Physically abused: Not on file    Forced sexual activity: Not on file  Other Topics Concern  . Not on file  Social History Narrative   In health care; alochol/beer every other day; smoke 5 cig/day; in [place]    FAMILY HISTORY: Family History  Problem Relation Age of Onset  . Stomach cancer Mother 6750  . Cirrhosis Father   . Breast  cancer Sister 8360  . Throat cancer Other     ALLERGIES:  is allergic to codeine and percocet [oxycodone-acetaminophen].  MEDICATIONS:  Current Outpatient Medications  Medication Sig Dispense Refill  . Alum & Mag Hydroxide-Simeth (MAALOX ADVANCED PO) Take 1 tablet by mouth 3 times/day as needed-between meals & bedtime (gi distress).     . cyclobenzaprine (FLEXERIL) 10 MG tablet Take 10 mg by mouth at bedtime.    . ferrous sulfate 325 (65 FE) MG tablet Take 325 mg by mouth 3 (three) times daily with meals.    . naproxen (NAPROSYN) 500 MG tablet Take 500 mg by mouth daily.     No current facility-administered medications for this visit.       Marland Kitchen.  PHYSICAL EXAMINATION: ECOG PERFORMANCE STATUS: 1 - Symptomatic but completely ambulatory  Vitals:   03/10/18 1316  BP: (!) 143/79  Pulse: 92  Resp: 20  Temp: (!) 96.3 F (35.7 C)   Filed Weights   03/10/18 1316  Weight: 239 lb (108.4 kg)    Physical Exam  Constitutional: She is oriented to person, place, and time and well-developed, well-nourished, and in no distress.  HENT:  Head: Normocephalic and atraumatic.  Mouth/Throat: Oropharynx is clear and moist. No oropharyngeal exudate.  Eyes: Pupils are equal, round, and reactive to light.  Neck: Normal range of motion. Neck supple.  Cardiovascular: Normal rate and regular rhythm.  Pulmonary/Chest: No respiratory distress. She has no wheezes.  Abdominal: Soft. Bowel sounds are normal. She exhibits no distension and no mass. There is no tenderness. There is no rebound and no guarding.  Musculoskeletal: Normal range of motion. She exhibits no edema or tenderness.  Lymphadenopathy:  Bilateral left more than right up to 1 cm lymph node felt.  Rubbery.  Neurological: She is alert and oriented to person, place, and time.  Skin: Skin is warm.  Psychiatric: Affect normal.     LABORATORY DATA:  I have reviewed the data as listed Lab Results  Component Value Date   WBC 5.7 02/24/2018    HGB 8.3 (L) 02/24/2018   HCT 27.8 (L) 02/24/2018   MCV 87.0 02/24/2018   PLT 266 02/24/2018   No results for input(s): NA, K, CL, CO2, GLUCOSE, BUN, CREATININE, CALCIUM, GFRNONAA, GFRAA, PROT, ALBUMIN, AST, ALT, ALKPHOS, BILITOT, BILIDIR, IBILI in the last 8760 hours.  RADIOGRAPHIC STUDIES: I have personally reviewed the radiological images as listed and agreed with the findings in the report. No results found.  ASSESSMENT & PLAN:   Microcytic anemia #Anemia hemoglobin 7-8; MCV 80.  Prior hemoglobin in 2017 within normal limits.  #Extensive work-up including iron studies/myeloma panel negative.  Question anemia of chronic disease.  Recommend chest x-ray given history of smoking.  #Bilateral axillary adenopathy-await above work-up; will likely need further imaging.  #Shortness of breath and exertion/smoker-COPD versus other  causes.  See above  #Smoker discussed regarding smoking cessation;    # CXR today; CT A/P- asap; follow up to be decided.     All questions were answered. The patient knows to call the clinic with any problems, questions or concerns.    Earna Coder, MD 03/14/2018 12:18 AM

## 2018-03-10 NOTE — Assessment & Plan Note (Addendum)
#  Anemia hemoglobin 7-8; MCV 80.  Prior hemoglobin in 2017 within normal limits.  #Extensive work-up including iron studies/myeloma panel negative.  Question anemia of chronic disease.  Recommend chest x-ray given history of smoking.  #Bilateral axillary adenopathy-await above work-up; will likely need further imaging.  #Shortness of breath and exertion/smoker-COPD versus other causes.  See above  #Smoker discussed regarding smoking cessation;    # CXR today; CT A/P- asap; follow up to be decided.

## 2018-03-13 ENCOUNTER — Ambulatory Visit: Payer: Self-pay

## 2018-03-14 ENCOUNTER — Telehealth: Payer: Self-pay | Admitting: Internal Medicine

## 2018-03-14 NOTE — Telephone Encounter (Signed)
Please schedule CT of the abdomen pelvis; chest x-ray.  ASAP.  Thank you   

## 2018-03-16 ENCOUNTER — Telehealth: Payer: Self-pay | Admitting: Internal Medicine

## 2018-03-16 NOTE — Telephone Encounter (Signed)
Concerns of anemia ? Related to chronic disease/malignancy.  Pt unable to get scans/x-ray because of insurance reasons.   Please make a referral to [facility] social work re: lack of insurance.   I also left a message for pt's PCP Dr.Sarah Allena Katz to discuss above.

## 2018-03-17 NOTE — Telephone Encounter (Signed)
Referral message has been sent to [facility].

## 2018-04-19 ENCOUNTER — Ambulatory Visit
Admission: RE | Admit: 2018-04-19 | Discharge: 2018-04-19 | Disposition: A | Discharge status: Home / Self Care | Payer: Self-pay | Source: Ambulatory Visit | Attending: Oncology | Admitting: Oncology

## 2018-04-19 ENCOUNTER — Other Ambulatory Visit: Payer: Self-pay

## 2018-04-19 ENCOUNTER — Ambulatory Visit: Payer: Self-pay | Attending: Oncology | Admitting: *Deleted

## 2018-04-19 ENCOUNTER — Encounter (INDEPENDENT_AMBULATORY_CARE_PROVIDER_SITE_OTHER): Payer: Self-pay

## 2018-04-19 ENCOUNTER — Encounter: Payer: Self-pay | Admitting: *Deleted

## 2018-04-19 VITALS — BP 119/72 | HR 96 | Temp 98.2°F | Ht 69.0 in | Wt 240.0 lb

## 2018-04-19 DIAGNOSIS — Z0000 Encounter for general adult medical examination without abnormal findings: Secondary | ICD-10-CM | POA: Insufficient documentation

## 2018-04-19 NOTE — Patient Instructions (Signed)
Gave patient hand-out, Women Staying Healthy, Active and Well from [facility], with education on breast health, pap smears, heart and colon health. 

## 2018-04-19 NOTE — Progress Notes (Signed)
  Subjective:     Patient ID: Raynaldo Opitz, female   DOB: 28-Jul-1965, 52 y.o.   MRN: [id number]  HPI   Review of Systems     Objective:   Physical Exam  Pulmonary/Chest: Right breast exhibits no inverted nipple, no mass, no nipple discharge, no skin change and no tenderness. Left breast exhibits no inverted nipple, no mass, no nipple discharge, no skin change and no tenderness.  Large pendulous breast       Assessment:     52 year old Black female referred from [facility] for clinical breast exam and mammogram.  Clinical breast exam unremarkable.  Taught self breast awareness.  Patient's last pap on 12/28/07 was negative.  States she would like to have Dr. Allena Katz at the [facility] do her pap and she will call to make an appointment.  Patient has been screened for eligibility.  She does not have any insurance, Medicare or Medicaid.  She also meets financial eligibility.  Hand-out given on the Affordable Care Act.  Risk Assessment    Risk Scores      04/19/2018   Last edited by: Scarlett Presto, RN   5-year risk: 1.6 %   Lifetime risk: 10 %            Plan:     Screening mammogram ordered.  Will follow-up per [facility] protocol.

## 2018-05-23 ENCOUNTER — Encounter: Payer: Self-pay | Admitting: *Deleted

## 2018-05-23 NOTE — Progress Notes (Signed)
Letter mailed from the [facility] to inform patient of her normal mammogram results.  Patient is to follow-up with annual screening in one year.  HSIS to Christy. 

## 2018-05-31 ENCOUNTER — Telehealth: Payer: Self-pay | Admitting: *Deleted

## 2018-05-31 DIAGNOSIS — D509 Iron deficiency anemia, unspecified: Secondary | ICD-10-CM

## 2018-05-31 NOTE — Telephone Encounter (Signed)
Received call from Dr. Maryruth HancockSallie Patel regarding patient not having xray and Ct as advised by Dr. Donneta RombergBrahmanday due to uninsured status.   After discussion with our social work, contacted patient and gave information about reduced billing percentage for uninsured status as well as ACA signup deadline in 4 days, also other options for financial assistance [facility]  Patient is agreeable to scheduling of chest xray and CT scan.

## 2018-06-01 NOTE — Telephone Encounter (Signed)
Colette, please schedule a lab/MD follow up with Dr. B a few days after the CT scan. Thanks!

## 2018-06-01 NOTE — Addendum Note (Signed)
Addended by: Janelle FloorHOMPSON, MONICA B on: 06/01/2018 11:37 AM   Modules accepted: Orders

## 2018-06-05 ENCOUNTER — Ambulatory Visit
Admission: RE | Admit: 2018-06-05 | Discharge: 2018-06-05 | Disposition: A | Discharge status: Home / Self Care | Payer: Self-pay | Source: Ambulatory Visit | Attending: Internal Medicine | Admitting: Internal Medicine

## 2018-06-05 DIAGNOSIS — D509 Iron deficiency anemia, unspecified: Secondary | ICD-10-CM | POA: Insufficient documentation

## 2018-06-05 MED ORDER — IOPAMIDOL (ISOVUE-300) INJECTION 61%
100.0000 mL | Freq: Once | INTRAVENOUS | Status: AC | PRN
  Administered 2018-06-05: 100 mL via INTRAVENOUS

## 2018-06-08 ENCOUNTER — Inpatient Hospital Stay: Payer: Self-pay | Admitting: Internal Medicine

## 2018-06-08 ENCOUNTER — Inpatient Hospital Stay: Payer: Self-pay

## 2018-06-08 NOTE — Assessment & Plan Note (Signed)
#  Anemia hemoglobin 7-8; MCV 80.  Prior hemoglobin in 2017 within normal limits.  #Extensive work-up including iron studies/myeloma panel negative.  Question anemia of chronic disease.  Recommend chest x-ray given history of smoking.  #Bilateral axillary adenopathy-await above work-up; will likely need further imaging.  #Shortness of breath and exertion/smoker-COPD versus other causes.  See above  #Smoker discussed regarding smoking cessation;    # CXR today; CT A/P- asap; follow up to be decided.   

## 2018-06-08 NOTE — Progress Notes (Unsigned)
[name] CONSULT NOTE  Patient Care Team: Hillery AldoPatel, Sarah, MD as PCP - General (Family Medicine)  CHIEF COMPLAINTS/PURPOSE OF CONSULTATION: Anemia  # AUG 2019-anemia 7-8;  MCV 80; electrophoresis-normal/PCP; ferritin 15; creat 0.88; LFTs-N; LDH- N;   #Hepatitis C positive [HCV >11]/ B-12/ folate/   #Active smoker.  No history exists.     HISTORY OF PRESENTING ILLNESS:  Morgan Hicks 52 y.o.  female with no significant past medical history except for untreated hep C has been referred to us for new diagnosis of microcytic anemia.  Patient complains of mild fatigue.  Otherwise denies any blood in stools black or stools.  Denies any vaginal bleeding.  She is awaiting hematology evaluation-to proceed with treatment for her hepatitis C.  Patient has chronic back pain/myalgias.  10 pounds weight loss in the last 3 months.  No cough.  Mild shortness of breath on exertion.  Review of Systems  Constitutional: Positive for malaise/fatigue and weight loss. Negative for chills, diaphoresis and fever.  HENT: Negative for nosebleeds and sore throat.   Eyes: Negative for double vision.  Respiratory: Positive for shortness of breath. Negative for cough, hemoptysis, sputum production and wheezing.   Cardiovascular: Negative for chest pain, palpitations, orthopnea and leg swelling.  Gastrointestinal: Negative for abdominal pain, blood in stool, constipation, diarrhea, heartburn, melena, nausea and vomiting.  Genitourinary: Negative for dysuria, frequency and urgency.  Musculoskeletal: Positive for back pain and myalgias. Negative for joint pain.  Skin: Negative.  Negative for itching and rash.  Neurological: Negative for dizziness, tingling, focal weakness, weakness and headaches.  Endo/Heme/Allergies: Does not bruise/bleed easily.  Psychiatric/Behavioral: Negative for depression. The patient is not nervous/anxious and does not have insomnia.      MEDICAL HISTORY:  Past Medical  History:  Diagnosis Date  . Atopic dermatitis   . Constipation   . Depression   . GERD (gastroesophageal reflux disease)   . Hepatitis C   . Sciatica   . Tobacco use     SURGICAL HISTORY: Past Surgical History:  Procedure Laterality Date  . paraguard IUD surgery  05/22/2002    SOCIAL HISTORY: Social History   Socioeconomic History  . Marital status: Single    Spouse name: Not on file  . Number of children: Not on file  . Years of education: Not on file  . Highest education level: Not on file  Occupational History  . Not on file  Social Needs  . Financial resource strain: Not on file  . Food insecurity:    Worry: Not on file    Inability: Not on file  . Transportation needs:    Medical: Not on file    Non-medical: Not on file  Tobacco Use  . Smoking status: Current Every Day Smoker    Years: 10.00    Types: Cigarettes  . Smokeless tobacco: Never Used  . Tobacco comment: 5 cigerattes per day  Substance and Sexual Activity  . Alcohol use: No  . Drug use: Not on file  . Sexual activity: Not on file  Lifestyle  . Physical activity:    Days per week: Not on file    Minutes per session: Not on file  . Stress: Not on file  Relationships  . Social connections:    Talks on phone: Not on file    Gets together: Not on file    Attends religious service: Not on file    Active member of club or organization: Not on file  Attends meetings of clubs or organizations: Not on file    Relationship status: Not on file  . Intimate partner violence:    Fear of current or ex partner: Not on file    Emotionally abused: Not on file    Physically abused: Not on file    Forced sexual activity: Not on file  Other Topics Concern  . Not on file  Social History Narrative   In health care; alochol/beer every other day; smoke 5 cig/day; in [place]    FAMILY HISTORY: Family History  Problem Relation Age of Onset  . Stomach cancer Mother [id number]  . Cervical cancer Mother   .  Cirrhosis Father   . Breast cancer Sister [id number]  . Throat cancer Other     ALLERGIES:  is allergic to codeine and percocet [oxycodone-acetaminophen].  MEDICATIONS:  Current Outpatient Medications  Medication Sig Dispense Refill  . Alum & Mag Hydroxide-Simeth (MAALOX ADVANCED PO) Take 1 tablet by mouth 3 times/day as needed-between meals & bedtime (gi distress).     . cyclobenzaprine (FLEXERIL) 10 MG tablet Take 10 mg by mouth at bedtime.    . ferrous sulfate 325 (65 FE) MG tablet Take 325 mg by mouth 3 (three) times daily with meals.    . naproxen (NAPROSYN) 500 MG tablet Take 500 mg by mouth daily.     No current facility-administered medications for this visit.       Marland Kitchen.  PHYSICAL EXAMINATION: ECOG PERFORMANCE STATUS: 1 - Symptomatic but completely ambulatory  There were no vitals filed for this visit. There were no vitals filed for this visit.  Physical Exam  Constitutional: She is oriented to person, place, and time and well-developed, well-nourished, and in no distress.  HENT:  Head: Normocephalic and atraumatic.  Mouth/Throat: Oropharynx is clear and moist. No oropharyngeal exudate.  Eyes: Pupils are equal, round, and reactive to light.  Neck: Normal range of motion. Neck supple.  Cardiovascular: Normal rate and regular rhythm.  Pulmonary/Chest: No respiratory distress. She has no wheezes.  Abdominal: Soft. Bowel sounds are normal. She exhibits no distension and no mass. There is no abdominal tenderness. There is no rebound and no guarding.  Musculoskeletal: Normal range of motion.        General: No tenderness or edema.  Lymphadenopathy:  Bilateral left more than right up to 1 cm lymph node felt.  Rubbery.  Neurological: She is alert and oriented to person, place, and time.  Skin: Skin is warm.  Psychiatric: Affect normal.     LABORATORY DATA:  I have reviewed the data as listed Lab Results  Component Value Date   WBC 5.7 02/24/2018   HGB 8.3 (L) 02/24/2018    HCT 27.8 (L) 02/24/2018   MCV 87.0 02/24/2018   PLT 266 02/24/2018   No results for input(s): NA, K, CL, CO2, GLUCOSE, BUN, CREATININE, CALCIUM, GFRNONAA, GFRAA, PROT, ALBUMIN, AST, ALT, ALKPHOS, BILITOT, BILIDIR, IBILI in the last 8760 hours.  RADIOGRAPHIC STUDIES: I have personally reviewed the radiological images as listed and agreed with the findings in the report. Ct Abdomen Pelvis W Contrast  Result Date: 06/05/2018 CLINICAL DATA:  Unexplained microcytic anemia. Lower abdominal pain. Hepatitis-C. Remote appendectomy. EXAM: CT ABDOMEN AND PELVIS WITH CONTRAST TECHNIQUE: Multidetector CT imaging of the abdomen and pelvis was performed using the standard protocol following bolus administration of intravenous contrast. CONTRAST:  100mL ISOVUE-300 IOPAMIDOL (ISOVUE-300) INJECTION 61% COMPARISON:  02/28/2014 abdominal radiographs. 08/26/2010 abdominal sonogram. FINDINGS: Lower chest: No significant pulmonary nodules or acute  consolidative airspace disease. Hepatobiliary: Normal liver size with no definite liver surface irregularity. No liver mass. Normal gallbladder with no radiopaque cholelithiasis. No biliary ductal dilatation. Pancreas: Normal, with no mass or duct dilation. Spleen: Normal size. No mass. Adrenals/Urinary Tract: Normal adrenals. Normal kidneys with no hydronephrosis and no renal mass. Prominent normal columns of Bertin in the interpolar kidneys bilaterally. Normal bladder. Stomach/Bowel: There is generalized fold thickening throughout the proximal stomach. Stomach is nondistended and otherwise normal. Normal caliber small bowel with no small bowel wall thickening. Normal appendix. Normal large bowel with no diverticulosis, large bowel wall thickening or pericolonic fat stranding. Oral contrast transits to the left colon. Vascular/Lymphatic: Atherosclerotic nonaneurysmal abdominal aorta. Patent portal, splenic, hepatic and renal veins. Mildly enlarged 1.2 cm gastrohepatic ligament node  (series 2/image 20). No additional pathologically enlarged lymph nodes in the abdomen or pelvis. Reproductive: Grossly normal uterus.  No adnexal mass. Other: No pneumoperitoneum, ascites or focal fluid collection. Musculoskeletal: No aggressive appearing focal osseous lesions. Mild lumbar spondylosis. IMPRESSION: 1. Nonspecific generalized fold thickening throughout the proximal stomach, without a discrete gastric mass. Upper endoscopic correlation may be considered to exclude a neoplastic process, particularly given the associated nonspecific mild gastrohepatic ligament adenopathy. Follow-up CT abdomen/pelvis with oral and IV contrast suggested in 3 months to reassess the gastrohepatic ligament adenopathy. This recommendation follows ACR consensus guidelines: White Paper of the ACR Incidental Findings Committee II on Splenic and Nodal Findings. J Am Coll Radiol [phone]. 2. Otherwise no evidence of bowel obstruction or acute bowel inflammation. Normal appendix. 3.  Aortic Atherosclerosis (ICD10-I70.0). Electronically Signed   By: Delbert Phenix M.D.   On: 06/05/2018 15:58    ASSESSMENT & PLAN:   No problem-specific Assessment & Plan notes found for this encounter.   All questions were answered. The patient knows to call the clinic with any problems, questions or concerns.    Earna Coder, MD 06/08/2018 8:50 AM

## 2018-06-12 ENCOUNTER — Inpatient Hospital Stay (HOSPITAL_BASED_OUTPATIENT_CLINIC_OR_DEPARTMENT_OTHER): Payer: Self-pay | Admitting: Internal Medicine

## 2018-06-12 ENCOUNTER — Encounter: Payer: Self-pay | Admitting: *Deleted

## 2018-06-12 ENCOUNTER — Inpatient Hospital Stay: Payer: Self-pay | Attending: Internal Medicine

## 2018-06-12 ENCOUNTER — Encounter: Payer: Self-pay | Admitting: Internal Medicine

## 2018-06-12 ENCOUNTER — Other Ambulatory Visit: Payer: Self-pay

## 2018-06-12 VITALS — BP 127/77 | HR 88 | Temp 97.6°F | Resp 20 | Wt 244.0 lb

## 2018-06-12 DIAGNOSIS — K3189 Other diseases of stomach and duodenum: Secondary | ICD-10-CM

## 2018-06-12 DIAGNOSIS — F17210 Nicotine dependence, cigarettes, uncomplicated: Secondary | ICD-10-CM | POA: Insufficient documentation

## 2018-06-12 DIAGNOSIS — R1030 Lower abdominal pain, unspecified: Secondary | ICD-10-CM | POA: Insufficient documentation

## 2018-06-12 DIAGNOSIS — D509 Iron deficiency anemia, unspecified: Secondary | ICD-10-CM | POA: Insufficient documentation

## 2018-06-12 DIAGNOSIS — B1920 Unspecified viral hepatitis C without hepatic coma: Secondary | ICD-10-CM

## 2018-06-12 DIAGNOSIS — Z79899 Other long term (current) drug therapy: Secondary | ICD-10-CM

## 2018-06-12 DIAGNOSIS — R590 Localized enlarged lymph nodes: Secondary | ICD-10-CM | POA: Insufficient documentation

## 2018-06-12 LAB — COMPREHENSIVE METABOLIC PANEL
ALT: 34 U/L (ref 0–44)
AST: 43 U/L — AB (ref 15–41)
Albumin: 3.4 g/dL — ABNORMAL LOW (ref 3.5–5.0)
Alkaline Phosphatase: 98 U/L (ref 38–126)
Anion gap: 9 (ref 5–15)
BILIRUBIN TOTAL: 0.4 mg/dL (ref 0.3–1.2)
BUN: 8 mg/dL (ref 6–20)
CO2: 24 mmol/L (ref 22–32)
CREATININE: 0.93 mg/dL (ref 0.44–1.00)
Calcium: 8.7 mg/dL — ABNORMAL LOW (ref 8.9–10.3)
Chloride: 113 mmol/L — ABNORMAL HIGH (ref 98–111)
GFR calc non Af Amer: >60 mL/min (ref >60)
Glucose, Bld: 122 mg/dL — ABNORMAL HIGH (ref 70–99)
POTASSIUM: 3.7 mmol/L (ref 3.5–5.1)
Sodium: 146 mmol/L — ABNORMAL HIGH (ref 135–145)
TOTAL PROTEIN: 7.9 g/dL (ref 6.5–8.1)

## 2018-06-12 LAB — CBC WITH DIFFERENTIAL/PLATELET
Abs Immature Granulocytes: 0.01 10*3/uL (ref 0.00–0.07)
BASOS ABS: 0 10*3/uL (ref 0.0–0.1)
Basophils Relative: 1 %
EOS PCT: 3 %
Eosinophils Absolute: 0.1 10*3/uL (ref 0.0–0.5)
HCT: 30.2 % — ABNORMAL LOW (ref 36.0–46.0)
Hemoglobin: 8.9 g/dL — ABNORMAL LOW (ref 12.0–15.0)
Immature Granulocytes: 0 %
Lymphocytes Relative: 30 %
Lymphs Abs: 1.4 10*3/uL (ref 0.7–4.0)
MCH: 25.1 pg — AB (ref 26.0–34.0)
MCHC: 29.5 g/dL — AB (ref 30.0–36.0)
MCV: 85.3 fL (ref 80.0–100.0)
MONO ABS: 0.3 10*3/uL (ref 0.1–1.0)
Monocytes Relative: 7 %
NEUTROS ABS: 2.8 10*3/uL (ref 1.7–7.7)
NRBC: 0.4 % — AB (ref 0.0–0.2)
Neutrophils Relative %: 59 %
PLATELETS: 313 10*3/uL (ref 150–400)
RBC: 3.54 MIL/uL — AB (ref 3.87–5.11)
RDW: 18.4 % — ABNORMAL HIGH (ref 11.5–15.5)
WBC: 4.7 10*3/uL (ref 4.0–10.5)

## 2018-06-12 NOTE — Assessment & Plan Note (Addendum)
#  Anemia hemoglobin 7-8; MCV 80.  Prior hemoglobin in 2017 within normal limits; likely anemia of chronic disease.  Etiology is unclear.  #Gastric wall thickening-noted on the CT of the abdomen pelvis/also mild periportal adenopathy-recommend EGD for further evaluation.  Recommend GI evaluation.  #Left axillary rubbery adenopathy 1 to 2 cm in size; recent mammogram negative; will likely need further imaging-CT scan of the chest.  Will order at next visit.  #Hepatitis C untreated-await GI evaluation.  #Shortness of breath and exertion/smoker-COPD versus other causes.  Patient not had chest x-ray as recommended.  Patient will also need likely CT of the chest.  #Smoker discussed regarding smoking cessation; not interested.   # DISPOSITION: # CXR next week as ordered. # referral to [facility] asap- re: gastric thickening # follow up in 2 months/labs-cbc- Dr.B  

## 2018-06-12 NOTE — Progress Notes (Signed)
[facility] CONSULT NOTE  Patient Care Team: Hillery AldoPatel, Sarah, MD as PCP - General (Family Medicine)  CHIEF COMPLAINTS/PURPOSE OF CONSULTATION: Anemia  # AUG 2019-anemia 7-8;  MCV 80; electrophoresis-normal/PCP; ferritin 15; creat 0.88; LFTs-N; LDH- N;   #Hepatitis C positive [HCV >11]/ B-12/ folate; active smoker  #Active smoker.  No history exists.     HISTORY OF PRESENTING ILLNESS:  Morgan Hicks 52 y.o.  female with no significant past medical history except for untreated hep C/active smoker is here for follow-up.  Patient did not have imaging done because of insurance reasons.  She finally had CT abdomen pelvis done last week.  Did not have chest x-ray.  Patient continues to complain of worsening fatigue.  Denies any blood in stools or black or stools.  Complains of poor appetite.  Complains of shortness of breath on exertion.  Review of Systems  Constitutional: Positive for malaise/fatigue and weight loss. Negative for chills, diaphoresis and fever.  HENT: Negative for nosebleeds and sore throat.   Eyes: Negative for double vision.  Respiratory: Positive for shortness of breath. Negative for cough, hemoptysis, sputum production and wheezing.   Cardiovascular: Negative for chest pain, palpitations, orthopnea and leg swelling.  Gastrointestinal: Negative for abdominal pain, blood in stool, constipation, diarrhea, heartburn, melena, nausea and vomiting.  Genitourinary: Negative for dysuria, frequency and urgency.  Musculoskeletal: Positive for back pain and myalgias. Negative for joint pain.  Skin: Negative.  Negative for itching and rash.  Neurological: Negative for dizziness, tingling, focal weakness, weakness and headaches.  Endo/Heme/Allergies: Does not bruise/bleed easily.  Psychiatric/Behavioral: Negative for depression. The patient is not nervous/anxious and does not have insomnia.      MEDICAL HISTORY:  Past Medical History:  Diagnosis Date  . Atopic  dermatitis   . Constipation   . Depression   . GERD (gastroesophageal reflux disease)   . Hepatitis C   . Sciatica   . Tobacco use     SURGICAL HISTORY: Past Surgical History:  Procedure Laterality Date  . paraguard IUD surgery  05/22/2002    SOCIAL HISTORY: Social History   Socioeconomic History  . Marital status: Single    Spouse name: Not on file  . Number of children: Not on file  . Years of education: Not on file  . Highest education level: Not on file  Occupational History  . Not on file  Social Needs  . Financial resource strain: Not on file  . Food insecurity:    Worry: Not on file    Inability: Not on file  . Transportation needs:    Medical: Not on file    Non-medical: Not on file  Tobacco Use  . Smoking status: Current Every Day Smoker    Years: 10.00    Types: Cigarettes  . Smokeless tobacco: Never Used  . Tobacco comment: 5 cigerattes per day  Substance and Sexual Activity  . Alcohol use: No  . Drug use: Not on file  . Sexual activity: Not on file  Lifestyle  . Physical activity:    Days per week: Not on file    Minutes per session: Not on file  . Stress: Not on file  Relationships  . Social connections:    Talks on phone: Not on file    Gets together: Not on file    Attends religious service: Not on file    Active member of club or organization: Not on file    Attends meetings of clubs or organizations: Not  on file    Relationship status: Not on file  . Intimate partner violence:    Fear of current or ex partner: Not on file    Emotionally abused: Not on file    Physically abused: Not on file    Forced sexual activity: Not on file  Other Topics Concern  . Not on file  Social History Narrative   In health care; alochol/beer every other day; smoke 5 cig/day; in [place]    FAMILY HISTORY: Family History  Problem Relation Age of Onset  . Stomach cancer Mother 750  . Cervical cancer Mother   . Cirrhosis Father   . Breast cancer  Sister [id number]  . Throat cancer Other     ALLERGIES:  is allergic to codeine and percocet [oxycodone-acetaminophen].  MEDICATIONS:  Current Outpatient Medications  Medication Sig Dispense Refill  . Alum & Mag Hydroxide-Simeth (MAALOX ADVANCED PO) Take 1 tablet by mouth 3 times/day as needed-between meals & bedtime (gi distress).     . cyclobenzaprine (FLEXERIL) 10 MG tablet Take 10 mg by mouth at bedtime.    . ferrous sulfate 325 (65 FE) MG tablet Take 325 mg by mouth 3 (three) times daily with meals.    . naproxen (NAPROSYN) 500 MG tablet Take 500 mg by mouth daily.     No current facility-administered medications for this visit.       Marland Kitchen.  PHYSICAL EXAMINATION: ECOG PERFORMANCE STATUS: 1 - Symptomatic but completely ambulatory  Vitals:   06/12/18 1515  BP: 127/77  Pulse: 88  Resp: 20  Temp: 97.6 F (36.4 C)   Filed Weights   06/12/18 1515  Weight: 244 lb (110.7 kg)    Physical Exam  Constitutional: She is oriented to person, place, and time and well-developed, well-nourished, and in no distress.  HENT:  Head: Normocephalic and atraumatic.  Mouth/Throat: Oropharynx is clear and moist. No oropharyngeal exudate.  Eyes: Pupils are equal, round, and reactive to light.  Neck: Normal range of motion. Neck supple.  Cardiovascular: Normal rate and regular rhythm.  Pulmonary/Chest: No respiratory distress. She has no wheezes.  Abdominal: Soft. Bowel sounds are normal. She exhibits no distension and no mass. There is no abdominal tenderness. There is no rebound and no guarding.  Musculoskeletal: Normal range of motion.        General: No tenderness or edema.  Lymphadenopathy:  Bilateral left more than right up to 1 cm lymph node felt.  Rubbery.  Neurological: She is alert and oriented to person, place, and time.  Skin: Skin is warm.  Psychiatric: Affect normal.     LABORATORY DATA:  I have reviewed the data as listed Lab Results  Component Value Date   WBC 4.7 06/12/2018    HGB 8.9 (L) 06/12/2018   HCT 30.2 (L) 06/12/2018   MCV 85.3 06/12/2018   PLT 313 06/12/2018   Recent Labs    06/12/18 1432  NA 146*  K 3.7  CL 113*  CO2 24  GLUCOSE 122*  BUN 8  CREATININE 0.93  CALCIUM 8.7*  GFRNONAA >60  GFRAA >60  PROT 7.9  ALBUMIN 3.4*  AST 43*  ALT 34  ALKPHOS 98  BILITOT 0.4    RADIOGRAPHIC STUDIES: I have personally reviewed the radiological images as listed and agreed with the findings in the report. Ct Abdomen Pelvis W Contrast  Result Date: 06/05/2018 CLINICAL DATA:  Unexplained microcytic anemia. Lower abdominal pain. Hepatitis-C. Remote appendectomy. EXAM: CT ABDOMEN AND PELVIS WITH CONTRAST TECHNIQUE: Multidetector CT  imaging of the abdomen and pelvis was performed using the standard protocol following bolus administration of intravenous contrast. CONTRAST:  ISOVUE-300 IOPAMIDOL (ISOVUE-300) INJECTION 61% COMPARISON:  02/28/2014 abdominal radiographs. 08/26/2010 abdominal sonogram. FINDINGS: Lower chest: No significant pulmonary nodules or acute consolidative airspace disease. Hepatobiliary: Normal liver size with no definite liver surface irregularity. No liver mass. Normal gallbladder with no radiopaque cholelithiasis. No biliary ductal dilatation. Pancreas: Normal, with no mass or duct dilation. Spleen: Normal size. No mass. Adrenals/Urinary Tract: Normal adrenals. Normal kidneys with no hydronephrosis and no renal mass. Prominent normal columns of Bertin in the interpolar kidneys bilaterally. Normal bladder. Stomach/Bowel: There is generalized fold thickening throughout the proximal stomach. Stomach is nondistended and otherwise normal. Normal caliber small bowel with no small bowel wall thickening. Normal appendix. Normal large bowel with no diverticulosis, large bowel wall thickening or pericolonic fat stranding. Oral contrast transits to the left colon. Vascular/Lymphatic: Atherosclerotic nonaneurysmal abdominal aorta. Patent portal,  splenic, hepatic and renal veins. Mildly enlarged 1.2 cm gastrohepatic ligament node (series 2/image 20). No additional pathologically enlarged lymph nodes in the abdomen or pelvis. Reproductive: Grossly normal uterus.  No adnexal mass. Other: No pneumoperitoneum, ascites or focal fluid collection. Musculoskeletal: No aggressive appearing focal osseous lesions. Mild lumbar spondylosis. IMPRESSION: 1. Nonspecific generalized fold thickening throughout the proximal stomach, without a discrete gastric mass. Upper endoscopic correlation may be considered to exclude a neoplastic process, particularly given the associated nonspecific mild gastrohepatic ligament adenopathy. Follow-up CT abdomen/pelvis with oral and IV contrast suggested in 3 months to reassess the gastrohepatic ligament adenopathy. This recommendation follows ACR consensus guidelines: White Paper of the ACR Incidental Findings Committee II on Splenic and Nodal Findings. J Am Coll Radiol [phone]. 2. Otherwise no evidence of bowel obstruction or acute bowel inflammation. Normal appendix. 3.  Aortic Atherosclerosis (ICD10-I70.0). Electronically Signed   By: Delbert Phenix M.D.   On: 06/05/2018 15:58    ASSESSMENT & PLAN:   Microcytic anemia #Anemia hemoglobin 7-8; MCV 80.  Prior hemoglobin in 2017 within normal limits; likely anemia of chronic disease.  Etiology is unclear.  #Gastric wall thickening-noted on the CT of the abdomen pelvis/also mild periportal adenopathy-recommend EGD for further evaluation.  Recommend GI evaluation.  #Left axillary rubbery adenopathy 1 to 2 cm in size; recent mammogram negative; will likely need further imaging-CT scan of the chest.  Will order at next visit.  #Hepatitis C untreated-await GI evaluation.  #Shortness of breath and exertion/smoker-COPD versus other causes.  Patient not had chest x-ray as recommended.  Patient will also need likely CT of the chest.  #Smoker discussed regarding smoking  cessation; not interested.   # DISPOSITION: # CXR next week as ordered. # referral to [facility] asap- re: gastric thickening # follow up in 2 months/labs-cbc- Dr.B    All questions were answered. The patient knows to call the clinic with any problems, questions or concerns.    Earna Coder, MD 06/12/2018 4:42 PM

## 2018-07-25 ENCOUNTER — Ambulatory Visit: Payer: Self-pay | Admitting: Gastroenterology

## 2018-07-25 NOTE — Progress Notes (Deleted)
She has been referred for gastric wall thickening . H/o hepatitis C untreated. She has been seen by DR Donneta Romberg for anemia- microcytic , new change from 2017 - felt to be secondary to chronic disease .  Underwent a CT scan of the abdomen on 06/05/18 and noted to have non sepcific generalized fold thickening without any mass, gastrohepatic lymphadenopathy noted.     Plan  1. EGD within 1 week to r/o gastric neoplasm/gastritis 2. Lymphadenopathy can be associated with hepatitis C, but will still need repeat imaging as suggested by radiology in 3 months .  4. Check Hep B/C/A/HIV serology 5. Liver elastography 6. Colonoscopy *** 7. Ferritin not checked- will check along with b12 and folate. If has iron deficiency then will need colonoscopy +/- capsule study of the small bowel .

## 2018-08-10 ENCOUNTER — Other Ambulatory Visit: Payer: Self-pay

## 2018-08-10 DIAGNOSIS — D509 Iron deficiency anemia, unspecified: Secondary | ICD-10-CM

## 2018-08-14 ENCOUNTER — Inpatient Hospital Stay: Payer: Self-pay | Admitting: Internal Medicine

## 2018-08-14 ENCOUNTER — Inpatient Hospital Stay: Payer: Self-pay | Attending: Internal Medicine

## 2018-08-14 NOTE — Progress Notes (Unsigned)
[facility] CONSULT NOTE  Patient Care Team: Hillery Aldo, MD as PCP - General (Family Medicine)  CHIEF COMPLAINTS/PURPOSE OF CONSULTATION: Anemia  # AUG 2019-anemia 7-8;  MCV 80; electrophoresis-normal/PCP; ferritin 15; creat 0.88; LFTs-N; LDH- N;   #Hepatitis C positive [HCV >11]/ B-12/ folate; active smoker  #Active smoker.  No history exists.     HISTORY OF PRESENTING ILLNESS:  Morgan Hicks 53 y.o.  female with no significant past medical history except for untreated hep C/active smoker is here for follow-up.  Patient did not have imaging done because of insurance reasons.  She finally had CT abdomen pelvis done last week.  Did not have chest x-ray.  Patient continues to complain of worsening fatigue.  Denies any blood in stools or black or stools.  Complains of poor appetite.  Complains of shortness of breath on exertion.  Review of Systems  Constitutional: Positive for malaise/fatigue and weight loss. Negative for chills, diaphoresis and fever.  HENT: Negative for nosebleeds and sore throat.   Eyes: Negative for double vision.  Respiratory: Positive for shortness of breath. Negative for cough, hemoptysis, sputum production and wheezing.   Cardiovascular: Negative for chest pain, palpitations, orthopnea and leg swelling.  Gastrointestinal: Negative for abdominal pain, blood in stool, constipation, diarrhea, heartburn, melena, nausea and vomiting.  Genitourinary: Negative for dysuria, frequency and urgency.  Musculoskeletal: Positive for back pain and myalgias. Negative for joint pain.  Skin: Negative.  Negative for itching and rash.  Neurological: Negative for dizziness, tingling, focal weakness, weakness and headaches.  Endo/Heme/Allergies: Does not bruise/bleed easily.  Psychiatric/Behavioral: Negative for depression. The patient is not nervous/anxious and does not have insomnia.      MEDICAL HISTORY:  Past Medical History:  Diagnosis Date  . Atopic  dermatitis   . Constipation   . Depression   . GERD (gastroesophageal reflux disease)   . Hepatitis C   . Sciatica   . Tobacco use     SURGICAL HISTORY: Past Surgical History:  Procedure Laterality Date  . paraguard IUD surgery  05/22/2002    SOCIAL HISTORY: Social History   Socioeconomic History  . Marital status: Single    Spouse name: Not on file  . Number of children: Not on file  . Years of education: Not on file  . Highest education level: Not on file  Occupational History  . Not on file  Social Needs  . Financial resource strain: Not on file  . Food insecurity:    Worry: Not on file    Inability: Not on file  . Transportation needs:    Medical: Not on file    Non-medical: Not on file  Tobacco Use  . Smoking status: Current Every Day Smoker    Years: 10.00    Types: Cigarettes  . Smokeless tobacco: Never Used  . Tobacco comment: 5 cigerattes per day  Substance and Sexual Activity  . Alcohol use: No  . Drug use: Not on file  . Sexual activity: Not on file  Lifestyle  . Physical activity:    Days per week: Not on file    Minutes per session: Not on file  . Stress: Not on file  Relationships  . Social connections:    Talks on phone: Not on file    Gets together: Not on file    Attends religious service: Not on file    Active member of club or organization: Not on file    Attends meetings of clubs or organizations: Not  on file    Relationship status: Not on file  . Intimate partner violence:    Fear of current or ex partner: Not on file    Emotionally abused: Not on file    Physically abused: Not on file    Forced sexual activity: Not on file  Other Topics Concern  . Not on file  Social History Narrative   In health care; alochol/beer every other day; smoke 5 cig/day; in [place]    FAMILY HISTORY: Family History  Problem Relation Age of Onset  . Stomach cancer Mother 36  . Cervical cancer Mother   . Cirrhosis Father   . Breast cancer  Sister 70  . Throat cancer Other     ALLERGIES:  is allergic to codeine and percocet [oxycodone-acetaminophen].  MEDICATIONS:  Current Outpatient Medications  Medication Sig Dispense Refill  . Alum & Mag Hydroxide-Simeth (MAALOX ADVANCED PO) Take 1 tablet by mouth 3 times/day as needed-between meals & bedtime (gi distress).     . cyclobenzaprine (FLEXERIL) 10 MG tablet Take 10 mg by mouth at bedtime.    . ferrous sulfate 325 (65 FE) MG tablet Take 325 mg by mouth 3 (three) times daily with meals.    . naproxen (NAPROSYN) 500 MG tablet Take 500 mg by mouth daily.     No current facility-administered medications for this visit.       Marland Kitchen  PHYSICAL EXAMINATION: ECOG PERFORMANCE STATUS: 1 - Symptomatic but completely ambulatory  There were no vitals filed for this visit. There were no vitals filed for this visit.  Physical Exam  Constitutional: She is oriented to person, place, and time and well-developed, well-nourished, and in no distress.  HENT:  Head: Normocephalic and atraumatic.  Mouth/Throat: Oropharynx is clear and moist. No oropharyngeal exudate.  Eyes: Pupils are equal, round, and reactive to light.  Neck: Normal range of motion. Neck supple.  Cardiovascular: Normal rate and regular rhythm.  Pulmonary/Chest: No respiratory distress. She has no wheezes.  Abdominal: Soft. Bowel sounds are normal. She exhibits no distension and no mass. There is no abdominal tenderness. There is no rebound and no guarding.  Musculoskeletal: Normal range of motion.        General: No tenderness or edema.  Lymphadenopathy:  Bilateral left more than right up to 1 cm lymph node felt.  Rubbery.  Neurological: She is alert and oriented to person, place, and time.  Skin: Skin is warm.  Psychiatric: Affect normal.     LABORATORY DATA:  I have reviewed the data as listed Lab Results  Component Value Date   WBC 4.7 06/12/2018   HGB 8.9 (L) 06/12/2018   HCT 30.2 (L) 06/12/2018   MCV 85.3  06/12/2018   PLT 313 06/12/2018   Recent Labs    06/12/18 1432  NA 146*  K 3.7  CL 113*  CO2 24  GLUCOSE 122*  BUN 8  CREATININE 0.93  CALCIUM 8.7*  GFRNONAA >60  GFRAA >60  PROT 7.9  ALBUMIN 3.4*  AST 43*  ALT 34  ALKPHOS 98  BILITOT 0.4    RADIOGRAPHIC STUDIES: I have personally reviewed the radiological images as listed and agreed with the findings in the report. No results found.  ASSESSMENT & PLAN:   Microcytic anemia #Anemia hemoglobin 7-8; MCV 80.  Prior hemoglobin in 2017 within normal limits; likely anemia of chronic disease.  Etiology is unclear.  #Gastric wall thickening-noted on the CT of the abdomen pelvis/also mild periportal adenopathy-recommend EGD for further evaluation.  Recommend GI evaluation.  #Left axillary rubbery adenopathy 1 to 2 cm in size; recent mammogram negative; will likely need further imaging-CT scan of the chest.  Will order at next visit.  #Hepatitis C untreated-await GI evaluation.  #Shortness of breath and exertion/smoker-COPD versus other causes.  Patient not had chest x-ray as recommended.  Patient will also need likely CT of the chest.  #Smoker discussed regarding smoking cessation; not interested.   # DISPOSITION: # CXR next week as ordered. # referral to [facility] asap- re: gastric thickening # follow up in 2 months/labs-cbc- Dr.B    All questions were answered. The patient knows to call the clinic with any problems, questions or concerns.    Earna Coder, MD 08/14/2018 8:29 AM

## 2018-08-14 NOTE — Assessment & Plan Note (Signed)
#  Anemia hemoglobin 7-8; MCV 80.  Prior hemoglobin in 2017 within normal limits; likely anemia of chronic disease.  Etiology is unclear.  #Gastric wall thickening-noted on the CT of the abdomen pelvis/also mild periportal adenopathy-recommend EGD for further evaluation.  Recommend GI evaluation.  #Left axillary rubbery adenopathy 1 to 2 cm in size; recent mammogram negative; will likely need further imaging-CT scan of the chest.  Will order at next visit.  #Hepatitis C untreated-await GI evaluation.  #Shortness of breath and exertion/smoker-COPD versus other causes.  Patient not had chest x-ray as recommended.  Patient will also need likely CT of the chest.  #Smoker discussed regarding smoking cessation; not interested.   # DISPOSITION: # CXR next week as ordered. # referral to [facility] asap- re: gastric thickening # follow up in 2 months/labs-cbc- Dr.B

## 2019-05-21 ENCOUNTER — Telehealth: Payer: Self-pay | Admitting: *Deleted

## 2019-05-21 NOTE — Telephone Encounter (Signed)
Attempted to call patient to prescreen for [facility] clinic.  No answer.  Left message to return my call.

## 2019-05-22 ENCOUNTER — Ambulatory Visit: Payer: Self-pay | Attending: Oncology

## 2020-10-22 ENCOUNTER — Encounter: Payer: Self-pay | Admitting: Emergency Medicine

## 2020-10-22 ENCOUNTER — Other Ambulatory Visit: Payer: Self-pay

## 2020-10-22 DIAGNOSIS — Z20822 Contact with and (suspected) exposure to covid-19: Secondary | ICD-10-CM | POA: Diagnosis present

## 2020-10-22 DIAGNOSIS — K5520 Angiodysplasia of colon without hemorrhage: Secondary | ICD-10-CM | POA: Diagnosis present

## 2020-10-22 DIAGNOSIS — Z79899 Other long term (current) drug therapy: Secondary | ICD-10-CM

## 2020-10-22 DIAGNOSIS — K259 Gastric ulcer, unspecified as acute or chronic, without hemorrhage or perforation: Principal | ICD-10-CM | POA: Diagnosis present

## 2020-10-22 DIAGNOSIS — K2970 Gastritis, unspecified, without bleeding: Secondary | ICD-10-CM | POA: Diagnosis present

## 2020-10-22 DIAGNOSIS — F32A Depression, unspecified: Secondary | ICD-10-CM | POA: Diagnosis present

## 2020-10-22 DIAGNOSIS — K2100 Gastro-esophageal reflux disease with esophagitis, without bleeding: Secondary | ICD-10-CM | POA: Diagnosis present

## 2020-10-22 DIAGNOSIS — B1920 Unspecified viral hepatitis C without hepatic coma: Secondary | ICD-10-CM | POA: Diagnosis present

## 2020-10-22 DIAGNOSIS — E876 Hypokalemia: Secondary | ICD-10-CM | POA: Diagnosis present

## 2020-10-22 DIAGNOSIS — E869 Volume depletion, unspecified: Secondary | ICD-10-CM | POA: Diagnosis present

## 2020-10-22 DIAGNOSIS — K31819 Angiodysplasia of stomach and duodenum without bleeding: Secondary | ICD-10-CM | POA: Diagnosis present

## 2020-10-22 DIAGNOSIS — K449 Diaphragmatic hernia without obstruction or gangrene: Secondary | ICD-10-CM | POA: Diagnosis present

## 2020-10-22 DIAGNOSIS — Z885 Allergy status to narcotic agent status: Secondary | ICD-10-CM

## 2020-10-22 DIAGNOSIS — D509 Iron deficiency anemia, unspecified: Secondary | ICD-10-CM | POA: Diagnosis present

## 2020-10-22 DIAGNOSIS — M5441 Lumbago with sciatica, right side: Secondary | ICD-10-CM | POA: Diagnosis present

## 2020-10-22 DIAGNOSIS — F17210 Nicotine dependence, cigarettes, uncomplicated: Secondary | ICD-10-CM | POA: Diagnosis present

## 2020-10-22 DIAGNOSIS — M5442 Lumbago with sciatica, left side: Secondary | ICD-10-CM | POA: Diagnosis present

## 2020-10-22 NOTE — ED Triage Notes (Signed)
Pt to ED from home c/o right lower back pain that radiates down right leg to toes.  Using muscle relaxers at home without relief today for sciatica.

## 2020-10-23 ENCOUNTER — Inpatient Hospital Stay
Admission: EM | Admit: 2020-10-23 | Discharge: 2020-10-25 | DRG: 384 | Disposition: A | Discharge status: Home / Self Care | Payer: Self-pay | Attending: Internal Medicine | Admitting: Internal Medicine

## 2020-10-23 ENCOUNTER — Encounter: Payer: Self-pay | Admitting: Internal Medicine

## 2020-10-23 DIAGNOSIS — K253 Acute gastric ulcer without hemorrhage or perforation: Secondary | ICD-10-CM

## 2020-10-23 DIAGNOSIS — K3189 Other diseases of stomach and duodenum: Secondary | ICD-10-CM

## 2020-10-23 DIAGNOSIS — M549 Dorsalgia, unspecified: Secondary | ICD-10-CM

## 2020-10-23 DIAGNOSIS — D509 Iron deficiency anemia, unspecified: Secondary | ICD-10-CM

## 2020-10-23 DIAGNOSIS — M5430 Sciatica, unspecified side: Secondary | ICD-10-CM

## 2020-10-23 DIAGNOSIS — K5520 Angiodysplasia of colon without hemorrhage: Secondary | ICD-10-CM

## 2020-10-23 DIAGNOSIS — R42 Dizziness and giddiness: Secondary | ICD-10-CM

## 2020-10-23 DIAGNOSIS — M5441 Lumbago with sciatica, right side: Secondary | ICD-10-CM

## 2020-10-23 DIAGNOSIS — K219 Gastro-esophageal reflux disease without esophagitis: Secondary | ICD-10-CM

## 2020-10-23 DIAGNOSIS — M5442 Lumbago with sciatica, left side: Secondary | ICD-10-CM

## 2020-10-23 DIAGNOSIS — D649 Anemia, unspecified: Secondary | ICD-10-CM | POA: Diagnosis present

## 2020-10-23 DIAGNOSIS — E876 Hypokalemia: Secondary | ICD-10-CM

## 2020-10-23 DIAGNOSIS — Z720 Tobacco use: Secondary | ICD-10-CM

## 2020-10-23 DIAGNOSIS — B182 Chronic viral hepatitis C: Secondary | ICD-10-CM

## 2020-10-23 LAB — BASIC METABOLIC PANEL
Anion gap: 7 (ref 5–15)
BUN: 11 mg/dL (ref 6–20)
CO2: 22 mmol/L (ref 22–32)
Calcium: 8.8 mg/dL — ABNORMAL LOW (ref 8.9–10.3)
Chloride: 113 mmol/L — ABNORMAL HIGH (ref 98–111)
Creatinine, Ser: 0.75 mg/dL (ref 0.44–1.00)
GFR, Estimated: >60 mL/min (ref >60)
Glucose, Bld: 116 mg/dL — ABNORMAL HIGH (ref 70–99)
Potassium: 3.9 mmol/L (ref 3.5–5.1)
Sodium: 142 mmol/L (ref 135–145)

## 2020-10-23 LAB — HEPATIC FUNCTION PANEL
ALT: 23 U/L (ref 0–44)
AST: 25 U/L (ref 15–41)
Albumin: 3.5 g/dL (ref 3.5–5.0)
Alkaline Phosphatase: 73 U/L (ref 38–126)
Bilirubin, Direct: 0.2 mg/dL (ref 0.0–0.2)
Indirect Bilirubin: 1.3 mg/dL — ABNORMAL HIGH (ref 0.3–0.9)
Total Bilirubin: 1.5 mg/dL — ABNORMAL HIGH (ref 0.3–1.2)
Total Protein: 7.5 g/dL (ref 6.5–8.1)

## 2020-10-23 LAB — CBC WITH DIFFERENTIAL/PLATELET
Abs Immature Granulocytes: 0.02 10*3/uL (ref 0.00–0.07)
Basophils Absolute: 0 10*3/uL (ref 0.0–0.1)
Basophils Relative: 1 %
Eosinophils Absolute: 0.1 10*3/uL (ref 0.0–0.5)
Eosinophils Relative: 1 %
HCT: 19.5 % — ABNORMAL LOW (ref 36.0–46.0)
Hemoglobin: 5.4 g/dL — ABNORMAL LOW (ref 12.0–15.0)
Immature Granulocytes: 0 %
Lymphocytes Relative: 23 %
Lymphs Abs: 1.4 10*3/uL (ref 0.7–4.0)
MCH: 21.6 pg — ABNORMAL LOW (ref 26.0–34.0)
MCHC: 27.7 g/dL — ABNORMAL LOW (ref 30.0–36.0)
MCV: 78 fL — ABNORMAL LOW (ref 80.0–100.0)
Monocytes Absolute: 0.5 10*3/uL (ref 0.1–1.0)
Monocytes Relative: 8 %
Neutro Abs: 4.1 10*3/uL (ref 1.7–7.7)
Neutrophils Relative %: 67 %
Platelets: 330 10*3/uL (ref 150–400)
RBC: 2.5 MIL/uL — ABNORMAL LOW (ref 3.87–5.11)
RDW: 20.4 % — ABNORMAL HIGH (ref 11.5–15.5)
WBC: 6.1 10*3/uL (ref 4.0–10.5)
nRBC: 0.5 % — ABNORMAL HIGH (ref 0.0–0.2)

## 2020-10-23 LAB — URINALYSIS, COMPLETE (UACMP) WITH MICROSCOPIC
Bilirubin Urine: NEGATIVE
Glucose, UA: NEGATIVE mg/dL
Hgb urine dipstick: NEGATIVE
Ketones, ur: NEGATIVE mg/dL
Leukocytes,Ua: NEGATIVE
Nitrite: NEGATIVE
Protein, ur: NEGATIVE mg/dL
Specific Gravity, Urine: 1.009 (ref 1.005–1.030)
Squamous Epithelial / HPF: NONE SEEN (ref 0–5)
pH: 5 (ref 5.0–8.0)

## 2020-10-23 LAB — FERRITIN: Ferritin: 6 ng/mL — ABNORMAL LOW (ref 11–307)

## 2020-10-23 LAB — LACTATE DEHYDROGENASE: LDH: 129 U/L (ref 98–192)

## 2020-10-23 LAB — PROTIME-INR
INR: 1 (ref 0.8–1.2)
Prothrombin Time: 13.6 seconds (ref 11.4–15.2)

## 2020-10-23 LAB — IRON AND TIBC
Iron: 31 ug/dL (ref 28–170)
Saturation Ratios: 6 % — ABNORMAL LOW (ref 10.4–31.8)
TIBC: 531 ug/dL — ABNORMAL HIGH (ref 250–450)
UIBC: 500 ug/dL

## 2020-10-23 LAB — RESP PANEL BY RT-PCR (FLU A&B, COVID) ARPGX2
Influenza A by PCR: NEGATIVE
Influenza B by PCR: NEGATIVE
SARS Coronavirus 2 by RT PCR: NEGATIVE

## 2020-10-23 LAB — HIV ANTIBODY (ROUTINE TESTING W REFLEX): HIV Screen 4th Generation wRfx: NONREACTIVE

## 2020-10-23 LAB — PATHOLOGIST SMEAR REVIEW

## 2020-10-23 LAB — CBC
HCT: 24.7 % — ABNORMAL LOW (ref 36.0–46.0)
Hemoglobin: 7.4 g/dL — ABNORMAL LOW (ref 12.0–15.0)
MCH: 23.6 pg — ABNORMAL LOW (ref 26.0–34.0)
MCHC: 30 g/dL (ref 30.0–36.0)
MCV: 78.7 fL — ABNORMAL LOW (ref 80.0–100.0)
Platelets: 297 10*3/uL (ref 150–400)
RBC: 3.14 MIL/uL — ABNORMAL LOW (ref 3.87–5.11)
RDW: 18.6 % — ABNORMAL HIGH (ref 11.5–15.5)
WBC: 5.3 10*3/uL (ref 4.0–10.5)
nRBC: 1.3 % — ABNORMAL HIGH (ref 0.0–0.2)

## 2020-10-23 LAB — RETICULOCYTES
Immature Retic Fract: 32.6 % — ABNORMAL HIGH (ref 2.3–15.9)
RBC.: 2.33 MIL/uL — ABNORMAL LOW (ref 3.87–5.11)
Retic Count, Absolute: 72 10*3/uL (ref 19.0–186.0)
Retic Ct Pct: 3.1 % (ref 0.4–3.1)

## 2020-10-23 LAB — FOLATE: Folate: 11.1 ng/mL (ref >5.9)

## 2020-10-23 LAB — GLUCOSE, CAPILLARY
Glucose-Capillary: 163 mg/dL — ABNORMAL HIGH (ref 70–99)
Glucose-Capillary: 162 mg/dL — ABNORMAL HIGH (ref 70–99)

## 2020-10-23 LAB — AMMONIA: Ammonia: 15 umol/L (ref 9–35)

## 2020-10-23 LAB — SAVE SMEAR(SSMR), FOR PROVIDER SLIDE REVIEW: Smear Review: NONE SEEN

## 2020-10-23 LAB — PREPARE RBC (CROSSMATCH)

## 2020-10-23 LAB — APTT: aPTT: 27 seconds (ref 24–36)

## 2020-10-23 LAB — VITAMIN B12: Vitamin B-12: 265 pg/mL (ref 180–914)

## 2020-10-23 LAB — ABO/RH: ABO/RH(D): A POS

## 2020-10-23 MED ORDER — FERROUS SULFATE 325 (65 FE) MG PO TABS
325.0000 mg | ORAL_TABLET | Freq: Three times a day (TID) | ORAL | Status: DC
  Administered 2020-10-23 – 2020-10-25 (×7): 325 mg via ORAL
  Filled 2020-10-23 (×7): qty 1

## 2020-10-23 MED ORDER — ONDANSETRON HCL 4 MG/2ML IJ SOLN: 4.0000 mg | Freq: Three times a day (TID) | INTRAMUSCULAR | Status: DC | PRN

## 2020-10-23 MED ORDER — MECLIZINE HCL 25 MG PO TABS
50.0000 mg | ORAL_TABLET | Freq: Once | ORAL | Status: AC
  Administered 2020-10-23: 50 mg via ORAL
  Filled 2020-10-23: qty 2

## 2020-10-23 MED ORDER — ACETAMINOPHEN 325 MG PO TABS
650.0000 mg | ORAL_TABLET | Freq: Four times a day (QID) | ORAL | Status: DC | PRN
  Administered 2020-10-23 – 2020-10-25 (×5): 650 mg via ORAL
  Filled 2020-10-23 (×5): qty 2

## 2020-10-23 MED ORDER — FENTANYL CITRATE (PF) 100 MCG/2ML IJ SOLN
25.0000 ug | INTRAMUSCULAR | Status: DC | PRN
  Administered 2020-10-23 (×4): 25 ug via INTRAVENOUS
  Filled 2020-10-23 (×4): qty 2

## 2020-10-23 MED ORDER — PANTOPRAZOLE SODIUM 40 MG IV SOLR
40.0000 mg | Freq: Two times a day (BID) | INTRAVENOUS | Status: DC
  Administered 2020-10-23 – 2020-10-25 (×5): 40 mg via INTRAVENOUS
  Filled 2020-10-23 (×5): qty 40

## 2020-10-23 MED ORDER — SODIUM CHLORIDE 0.9 % IV SOLN
510.0000 mg | Freq: Once | INTRAVENOUS | Status: AC
  Administered 2020-10-23: 20:00:00 510 mg via INTRAVENOUS
  Filled 2020-10-23: qty 17

## 2020-10-23 MED ORDER — MECLIZINE HCL 25 MG PO TABS
25.0000 mg | ORAL_TABLET | Freq: Three times a day (TID) | ORAL | Status: DC | PRN
  Administered 2020-10-24: 21:00:00 25 mg via ORAL
  Filled 2020-10-23 (×2): qty 1

## 2020-10-23 MED ORDER — DEXAMETHASONE SODIUM PHOSPHATE 10 MG/ML IJ SOLN
10.0000 mg | Freq: Once | INTRAMUSCULAR | Status: AC
  Administered 2020-10-23: 10 mg via INTRAVENOUS
  Filled 2020-10-23: qty 1

## 2020-10-23 MED ORDER — NICOTINE 21 MG/24HR TD PT24
21.0000 mg | MEDICATED_PATCH | Freq: Every day | TRANSDERMAL | Status: DC
  Administered 2020-10-23 – 2020-10-25 (×3): 21 mg via TRANSDERMAL
  Filled 2020-10-23 (×3): qty 1

## 2020-10-23 MED ORDER — SIMETHICONE 40 MG/0.6ML PO SUSP
80.0000 mg | Freq: Once | ORAL | Status: AC
  Administered 2020-10-24: 80 mg via ORAL
  Filled 2020-10-23 (×3): qty 1.2

## 2020-10-23 MED ORDER — SODIUM CHLORIDE 0.9 % IV SOLN
10.0000 mL/h | Freq: Once | INTRAVENOUS | Status: AC
  Administered 2020-10-23: 10 mL/h via INTRAVENOUS

## 2020-10-23 MED ORDER — SODIUM CHLORIDE 0.9 % IV BOLUS (SEPSIS)
1000.0000 mL | Freq: Once | INTRAVENOUS | Status: AC
  Administered 2020-10-23: 1000 mL via INTRAVENOUS

## 2020-10-23 MED ORDER — KETOROLAC TROMETHAMINE 30 MG/ML IJ SOLN
30.0000 mg | Freq: Once | INTRAMUSCULAR | Status: AC
  Administered 2020-10-23: 30 mg via INTRAVENOUS
  Filled 2020-10-23: qty 1

## 2020-10-23 MED ORDER — SODIUM CHLORIDE 0.9 % IV SOLN
INTRAVENOUS | Status: DC
  Administered 2020-10-24: 20 mL/h via INTRAVENOUS

## 2020-10-23 MED ORDER — METHOCARBAMOL 500 MG PO TABS
500.0000 mg | ORAL_TABLET | Freq: Three times a day (TID) | ORAL | Status: DC | PRN
  Administered 2020-10-23 – 2020-10-25 (×4): 500 mg via ORAL
  Filled 2020-10-23 (×7): qty 1

## 2020-10-23 NOTE — Consult Note (Signed)
Melodie Bouillon, MD [address], [address], [place], [place], [address] [address], [place], [place], [address] Phone: [phone]  Fax: [phone]  Consultation  Referring Provider:     Dr. Clyde Lundborg Primary Care Physician:  Hillery Aldo, MD Reason for Consultation:    Anemia  Date of Admission:  10/23/2020 Date of Consultation:  10/23/2020         HPI:   Morgan Hicks is a 55 y.o. female who presents with dizziness and back pain.  GI consulted for iron deficiency anemia.  Patient denies any signs of active bleeding from any sources.  No melena, hematochezia, hematemesis.  Denies any abdominal pain.  No dysphagia.  Patient has previously been seen by hematology oncology, Dr. Donneta Romberg, back in 2020 last, and GI referral was recommended.  Patient has not seen any GI yet.  She also has a history of hep C, untreated, and patient supposed to follow-up with GI/ID for this as well and never did so.  Patient does report heavy BC powder use over this last week.  However, continues to deny melena.  Patient CT scan in 2019 and in 2021, Shows Gastric Thickening  Past Medical History:  Diagnosis Date  . Atopic dermatitis   . Constipation   . Depression   . GERD (gastroesophageal reflux disease)   . Hepatitis C   . Sciatica   . Tobacco use     Past Surgical History:  Procedure Laterality Date  . paraguard IUD surgery  05/22/2002    Prior to Admission medications   Medication Sig Start Date End Date Taking? Authorizing Provider  Aspirin-Salicylamide-Caffeine (ARTHRITIS STRENGTH BC POWDER PO) Take 1 packet by mouth every 6 (six) hours as needed (pain).   Yes [provider]  cyclobenzaprine (FLEXERIL) 10 MG tablet Take 10 mg by mouth at bedtime.   Yes [provider]  Alum & Mag Hydroxide-Simeth (MAALOX ADVANCED PO) Take 1 tablet by mouth 3 times/day as needed-between meals & bedtime (gi distress).     [provider]  ferrous sulfate 325 (65 FE)  MG tablet Take 325 mg by mouth 3 (three) times daily with meals.    [provider]  naproxen (NAPROSYN) 500 MG tablet Take 500 mg by mouth daily.    [provider]    Family History  Problem Relation Age of Onset  . Stomach cancer Mother 17  . Cervical cancer Mother   . Cirrhosis Father   . Breast cancer Sister 52  . Throat cancer Other      Social History   Tobacco Use  . Smoking status: Current Every Day Smoker    Years: 10.00    Types: Cigarettes  . Smokeless tobacco: Never Used  . Tobacco comment: 5 cigerattes per day  Vaping Use  . Vaping Use: Never used  Substance Use Topics  . Alcohol use: No  . Drug use: Never    Allergies as of 10/22/2020 - Review Complete 10/22/2020  Allergen Reaction Noted  . Codeine Itching 10/05/2016  . Percocet [oxycodone-acetaminophen]  02/24/2018    Review of Systems:    All systems reviewed and negative except where noted in HPI.   Physical Exam:  Vital signs in last 24 hours: Vitals:   10/23/20 1130 10/23/20 1203 10/23/20 1448 10/23/20 1511  BP: (!) 169/82 (!) 151/70 (!) 153/73 (!) 150/75  Pulse: 95 81 85 88  Resp: 18 18 18 16   Temp: 98.5 F (36.9 C) 98 F (36.7 C)  98.5 F (36.9 C) 98.8 F (37.1 C)  TempSrc: Oral  Oral   SpO2:  99%  99%  Weight:      Height:         General:   Pleasant, cooperative in NAD Head:  Normocephalic and atraumatic. Eyes:   No icterus.   Conjunctiva pink. PERRLA. Ears:  Normal auditory acuity. Neck:  Supple; no masses or thyroidomegaly Lungs: Respirations even and unlabored. Lungs clear to auscultation bilaterally.   No wheezes, crackles, or rhonchi.  Abdomen:  Soft, nondistended, nontender. Normal bowel sounds. No appreciable masses or hepatomegaly.  No rebound or guarding.  Neurologic:  Alert and oriented x3;  grossly normal neurologically. Skin:  Intact without significant lesions or rashes. Cervical Nodes:  No significant cervical adenopathy. Psych:  Alert and  cooperative. Normal affect.  LAB RESULTS: Recent Labs    10/23/20 0459  WBC 6.1  HGB 5.4*  HCT 19.5*  PLT 330   BMET Recent Labs    10/23/20 0459  NA 142  K 3.9  CL 113*  CO2 22  GLUCOSE 116*  BUN 11  CREATININE 0.75  CALCIUM 8.8*   LFT No results for input(s): PROT, ALBUMIN, AST, ALT, ALKPHOS, BILITOT, BILIDIR, IBILI in the last 72 hours. PT/INR Recent Labs    10/23/20 0555  LABPROT 13.6  INR 1.0    STUDIES: No results found.    Impression / Plan:   Morgan Hicks is a 55 y.o. y/o female with acute on chronic iron deficiency anemia, with gastric thickening seen on CT scan in 2019 and 2021, with patient failing to follow-up with GI as recommended by hematology as an outpatient, with history of hep C, untreated, with no signs of active GI bleeding  Proceed with upper endoscopy tomorrow to evaluate gastric thickening noted on at least 2 CT scans at this point, and for further evaluation deficiency  If EGD is negative, patient will need colonoscopy subsequently  No signs of active GI bleeding, no indication for emergent EGD at this time  Continue medical optimization with PRBC transfusion as needed.  Hemoglobin was 5.4 on admission, patient is receiving PRBCs at this time and repeat levels will need to be done after that  Patient will need to follow-up as an outpatient for hep C treatment as well  Due to patient's recent [facility] powder use, patient may have underlying gastric or duodenal ulcers  PPI IV twice daily  Continue serial CBCs and transfuse PRN Avoid NSAIDs Maintain 2 large-bore IV lines Please page GI with any acute hemodynamic changes, or signs of active GI bleeding  I do not see a liver enzymes done recently, will add on to morning labs  I have discussed alternative options, risks & benefits,  which include, but are not limited to, bleeding, infection, perforation,respiratory complication & drug reaction.  The patient agrees with this plan & written  consent will be obtained.    If Patient states hemodynamically stable, with no signs of active GI bleeding, and responds to PRBC transfusion, ok to start PO diet but  n.p.o. past midnight for endoscopy tomorrow  Thank you for involving me in the care of this patient.      LOS: 0 days   Pasty Spillers, MD  10/23/2020, 4:14 PM

## 2020-10-23 NOTE — ED Notes (Addendum)
EDP discussed blood transfusion with patient. Pt agreeable to receive blood transfusion. Blood consent signed in chart under consents.

## 2020-10-23 NOTE — ED Notes (Addendum)
Dizziness with movement over last few days.

## 2020-10-23 NOTE — H&P (Signed)
History and Physical    Morgan Hicks:[id number] DOB: Jul 21, 1965 DOA: 10/23/2020  Referring MD/NP/PA:   PCP: Hillery Aldo, MD   Patient coming from:  The patient is coming from home.  At baseline, pt is independent for most of ADL.        Chief Complaint: dizziness and back pain  HPI: Morgan Hicks is a 55 y.o. female with medical history significant of back pain, HCV, GERD, depression, tobacco abuse, iron deficiency anemia, who presents with dizziness and back pain.  Pt states that she has hx of chronic lower back pain, which has been progressively worsening for nearly 2 weeks.  The pain is constant, sharp, moderate to severe, radiating down to the right leg. She has some toe numbness, no leg weakness.  No loss control of bladder or bowel movement.  No fever or chills.  No back injury. Patient also reports that she has dizziness and lightheadedness in the past several days.  Sometimes that she felt room spinning around her, but currently no spinning sensation.  No facial droop or slurred speech. Patient denies any chest pain, cough, shortness breath.  Denies melena, dark stool or rectal bleeding.  No nausea vomiting, diarrhea or abdominal pain. She is postmenopausal, no vaginal bleeding. No symptoms of UTI.  ED Course: pt was found to have hemoglobin 5.4 (8.9 on 06/12/2018), negative COVID PCR, negative FOBT, WBC 6.1, INR 1.0, electrolytes renal function okay, temperature normal, blood pressure 143/63, heart rate 79, RR 21, oxygen saturation 99% on room air.  Patient is admitted to MedSurg bed as inpatient.  Dr. Maximino Greenland of GI is consulted.   Review of Systems:   General: no fevers, chills, no body weight gain, has fatigue HEENT: no blurry vision, hearing changes or sore throat Respiratory: no dyspnea, coughing, wheezing CV: no chest pain, no palpitations GI: no nausea, vomiting, abdominal pain, diarrhea, constipation GU: no dysuria, burning on urination, increased urinary frequency,  hematuria  Ext: no leg edema Neuro: no unilateral weakness, numbness, or tingling, no vision change or hearing loss. Has dizziness and lightheadedness Skin: no rash, no skin tear. MSK: has back pain Heme: No easy bruising.  Travel history: No recent long distant travel.  Allergy:  Allergies  Allergen Reactions  . Codeine Itching  . Percocet [Oxycodone-Acetaminophen]     Past Medical History:  Diagnosis Date  . Atopic dermatitis   . Constipation   . Depression   . GERD (gastroesophageal reflux disease)   . Hepatitis C   . Sciatica   . Tobacco use     Past Surgical History:  Procedure Laterality Date  . paraguard IUD surgery  05/22/2002    Social History:  reports that she has been smoking cigarettes. She has smoked for the past 10.00 years. She has never used smokeless tobacco. She reports that she does not drink alcohol and does not use drugs.  Family History:  Family History  Problem Relation Age of Onset  . Stomach cancer Mother 82  . Cervical cancer Mother   . Cirrhosis Father   . Breast cancer Sister 26  . Throat cancer Other      Prior to Admission medications   Medication Sig Start Date End Date Taking? Authorizing Provider  Alum & Mag Hydroxide-Simeth (MAALOX ADVANCED PO) Take 1 tablet by mouth 3 times/day as needed-between meals & bedtime (gi distress).     [provider]  cyclobenzaprine (FLEXERIL) 10 MG tablet Take 10 mg by mouth at bedtime.  [provider]  ferrous sulfate 325 (65 FE) MG tablet Take 325 mg by mouth 3 (three) times daily with meals.    [provider]  naproxen (NAPROSYN) 500 MG tablet Take 500 mg by mouth daily.    [provider]    Physical Exam: Vitals:   10/23/20 1015 10/23/20 1044 10/23/20 1130 10/23/20 1203  BP: (!) 164/90 (!) 171/79 (!) 169/82 (!) 151/70  Pulse: 86 81 95 81  Resp: 17 17 18 18   Temp: 98.5 F (36.9 C) 97.7 F (36.5 C) 98.5 F (36.9 C) 98 F (36.7 C)  TempSrc:   Oral    SpO2: 99% 100%  99%  Weight:      Height:       General: Not in acute distress. Pale looking. HEENT:       Eyes: PERRL, EOMI, no scleral icterus.       ENT: No discharge from the ears and nose, no pharynx injection, no tonsillar enlargement.        Neck: No JVD, no bruit, no mass felt. Heme: No neck lymph node enlargement. Cardiac: S1/S2, RRR, No murmurs, No gallops or rubs. Respiratory: No rales, wheezing, rhonchi or rubs. GI: Soft, nondistended, nontender, no rebound pain, no organomegaly, BS present. GU: No hematuria Ext: No pitting leg edema bilaterally. 1+DP/PT pulse bilaterally. Musculoskeletal: has tenderness in lower back midline Skin: No rashes.  Neuro: Alert, oriented X3, cranial nerves II-XII grossly intact, moves all extremities. Psych: Patient is not psychotic, no suicidal or hemocidal ideation.  Labs on Admission: I have personally reviewed following labs and imaging studies  CBC: Recent Labs  Lab 10/23/20 0459  WBC 6.1  NEUTROABS 4.1  HGB 5.4*  HCT 19.5*  MCV 78.0*  PLT 330   Basic Metabolic Panel: Recent Labs  Lab 10/23/20 0459  NA 142  K 3.9  CL 113*  CO2 22  GLUCOSE 116*  BUN 11  CREATININE 0.75  CALCIUM 8.8*   GFR: Estimated Creatinine Clearance: 105.5 mL/min (by C-G formula based on SCr of 0.75 mg/dL). Liver Function Tests: No results for input(s): AST, ALT, ALKPHOS, BILITOT, PROT, ALBUMIN in the last 168 hours. No results for input(s): LIPASE, AMYLASE in the last 168 hours. No results for input(s): AMMONIA in the last 168 hours. Coagulation Profile: Recent Labs  Lab 10/23/20 0555  INR 1.0   Cardiac Enzymes: No results for input(s): CKTOTAL, CKMB, CKMBINDEX, TROPONINI in the last 168 hours. BNP (last 3 results) No results for input(s): PROBNP in the last 8760 hours. HbA1C: No results for input(s): HGBA1C in the last 72 hours. CBG: Recent Labs  Lab 10/23/20 1202  GLUCAP 163*   Lipid Profile: No results for input(s): CHOL,  HDL, LDLCALC, TRIG, CHOLHDL, LDLDIRECT in the last 72 hours. Thyroid Function Tests: No results for input(s): TSH, T4TOTAL, FREET4, T3FREE, THYROIDAB in the last 72 hours. Anemia Panel: Recent Labs    10/23/20 0459 10/23/20 0555  VITAMINB12 265  --   RETICCTPCT  --  3.1   Urine analysis:    Component Value Date/Time   COLORURINE YELLOW (A) 10/23/2020 0555   APPEARANCEUR CLEAR (A) 10/23/2020 0555   APPEARANCEUR Hazy 02/28/2014 1617   LABSPEC 1.009 10/23/2020 0555   LABSPEC 1.024 02/28/2014 1617   PHURINE 5.0 10/23/2020 0555   GLUCOSEU NEGATIVE 10/23/2020 0555   GLUCOSEU Negative 02/28/2014 1617   HGBUR NEGATIVE 10/23/2020 0555   BILIRUBINUR NEGATIVE 10/23/2020 0555   BILIRUBINUR Negative 02/28/2014 1617   KETONESUR NEGATIVE 10/23/2020 0555  PROTEINUR NEGATIVE 10/23/2020 0555   NITRITE NEGATIVE 10/23/2020 0555   LEUKOCYTESUR NEGATIVE 10/23/2020 0555   LEUKOCYTESUR 1+ 02/28/2014 1617   Sepsis Labs: @LABRCNTIP (procalcitonin:4,lacticidven:4) ) Recent Results (from the past 240 hour(s))  Resp Panel by RT-PCR (Flu A&B, Covid) Nasopharyngeal Swab     Status: None   Collection Time: 10/23/20  5:55 AM   Specimen: Nasopharyngeal Swab; Nasopharyngeal(NP) swabs in vial transport medium  Result Value Ref Range Status   SARS Coronavirus 2 by RT PCR NEGATIVE NEGATIVE Final    Comment: (NOTE) SARS-CoV-2 target nucleic acids are NOT DETECTED.  The SARS-CoV-2 RNA is generally detectable in upper respiratory specimens during the acute phase of infection. The lowest concentration of SARS-CoV-2 viral copies this assay can detect is 138 copies/mL. A negative result does not preclude SARS-Cov-2 infection and should not be used as the sole basis for treatment or other patient management decisions. A negative result may occur with  improper specimen collection/handling, submission of specimen other than nasopharyngeal swab, presence of viral mutation(s) within the areas targeted by this  assay, and inadequate number of viral copies(<138 copies/mL). A negative result must be combined with clinical observations, patient history, and epidemiological information. The expected result is Negative.  Fact Sheet for Patients:  12/23/20  Fact Sheet for Healthcare Providers:  BloggerCourse.com  This test is no t yet approved or cleared by the [employer] FDA and  has been authorized for detection and/or diagnosis of SARS-CoV-2 by FDA under an Emergency Use Authorization (EUA). This EUA will remain  in effect (meaning this test can be used) for the duration of the COVID-19 declaration under Section 564(b)(1) of the Act, 21 U.S.C.section 360bbb-3(b)(1), unless the authorization is terminated  or revoked sooner.       Influenza A by PCR NEGATIVE NEGATIVE Final   Influenza B by PCR NEGATIVE NEGATIVE Final    Comment: (NOTE) The Xpert Xpress SARS-CoV-2/FLU/RSV plus assay is intended as an aid in the diagnosis of influenza from Nasopharyngeal swab specimens and should not be used as a sole basis for treatment. Nasal washings and aspirates are unacceptable for Xpert Xpress SARS-CoV-2/FLU/RSV testing.  Fact Sheet for Patients: [place]  Fact Sheet for Healthcare Providers: BloggerCourse.com  This test is not yet approved or cleared by the [employer] FDA and has been authorized for detection and/or diagnosis of SARS-CoV-2 by FDA under an Emergency Use Authorization (EUA). This EUA will remain in effect (meaning this test can be used) for the duration of the COVID-19 declaration under Section 564(b)(1) of the Act, 21 U.S.C. section 360bbb-3(b)(1), unless the authorization is terminated or revoked.  Performed at [facility], [address], [place], [place]      Radiological Exams on Admission: No results found.   EKG: I have  personally reviewed.  Sinus rhythm, QTC 446, low voltage, early R wave progression  Assessment/Plan Principal Problem:   Symptomatic anemia Active Problems:   Iron deficiency anemia   Tobacco abuse   Sciatica   GERD (gastroesophageal reflux disease)   Dizziness   Symptomatic anemia: Hgb 8.9 -->5.4.  Suspect that the patient may have chronic GI blood loss.  Dr. [id number] of GI is consulted, planning to do endoscopy tomorrow.  - will admit to med-surg bed as inpt - transfuse 3 units of blood now - NPO after MN - IVF: 1L NS bolus - Start IV pantoprazole 40 mg bid - Zofran IV for nausea - Avoid NSAIDs and SQ heparin - Maintain IV access (2 large  bore IVs if possible). - Monitor closely and follow q6h cbc, transfuse as necessary, if Hgb<7.0 - LaB: INR, PTT and type screen  Iron deficiency anemia: Anemia panel results are consistent with iron deficiency anemia -will give 510 mg of IV Feraheme -continue home iron supplement  Tobacco abuse -Nicotine patch  Sciatica: No alarming symptoms. -As needed fentanyl, Robaxin, Tylenol  GERD (gastroesophageal reflux disease) -Protonix  Dizziness: Likely due to volume depletion and severe anemia -Blood transfusion as above -IV fluid as above    DVT ppx: SCD Code Status: Full code Family Communication: not done, no family member is at bed side.  Disposition Plan:  Anticipate discharge back to previous environment Consults called:  Dr. Maximino Greenland of GI Admission status and Level of care: Med-Surg:   as inpt        Status is: Inpatient  Remains inpatient appropriate because:Inpatient level of care appropriate due to severity of illness   Dispo: The patient is from: Home              Anticipated d/c is to: Home              Patient currently is not medically stable to d/c.   Difficult to place patient No          Date of Service 10/23/2020    Lorretta Harp [facility]   If 7PM-7AM, please contact  night-coverage [email or web address] 10/23/2020, 12:48 PM

## 2020-10-23 NOTE — ED Notes (Signed)
Pt called out stating she had urinated the bed stating she did not have a call bell in reach.   All linen changed including fitted sheet and new blankets provided. Pt escorted to bedside toilet as a one assist. NAD noted. Call bell now in reach and pt educated on use of call bell.

## 2020-10-23 NOTE — ED Provider Notes (Signed)
[facility] Emergency Department Provider Note  ____________________________________________   Event Date/Time   First MD Initiated Contact with Patient 10/23/20 [phone]     (approximate)  I have reviewed the triage vital signs and the nursing notes.   HISTORY  Chief Complaint Sciatica    HPI Morgan Hicks is a 55 y.o. female with history of hepatitis C, IDA presents to the emergency department with multiple different complaints.  States over the past couple of weeks she has had lower back pain that radiates around into her lower abdomen and down both of her legs.  She feels tingling in her toes bilaterally but no numbness or focal weakness.  No overflow incontinence, bowel incontinence, urinary retention.  No fevers.  No back injury.  No history of back surgeries.  Also reports feeling weak for the past couple of weeks.  She denies any chest pain, shortness of breath, vomiting or diarrhea.  States she has felt extremely dizzy and describes this as a spinning sensation.        Past Medical History:  Diagnosis Date  . Atopic dermatitis   . Constipation   . Depression   . GERD (gastroesophageal reflux disease)   . Hepatitis C   . Sciatica   . Tobacco use     Patient Active Problem List   Diagnosis Date Noted  . Microcytic anemia 02/24/2018    Past Surgical History:  Procedure Laterality Date  . paraguard IUD surgery  05/22/2002    Prior to Admission medications   Medication Sig Start Date End Date Taking? Authorizing Provider  Alum & Mag Hydroxide-Simeth (MAALOX ADVANCED PO) Take 1 tablet by mouth 3 times/day as needed-between meals & bedtime (gi distress).     [provider]  cyclobenzaprine (FLEXERIL) 10 MG tablet Take 10 mg by mouth at bedtime.    [provider]  ferrous sulfate 325 (65 FE) MG tablet Take 325 mg by mouth 3 (three) times daily with meals.    [provider]  naproxen (NAPROSYN) 500 MG tablet  Take 500 mg by mouth daily.    [provider]    Allergies Codeine and Percocet [oxycodone-acetaminophen]  Family History  Problem Relation Age of Onset  . Stomach cancer Mother 29  . Cervical cancer Mother   . Cirrhosis Father   . Breast cancer Sister 24  . Throat cancer Other     Social History Social History   Tobacco Use  . Smoking status: Current Every Day Smoker    Years: 10.00    Types: Cigarettes  . Smokeless tobacco: Never Used  . Tobacco comment: 5 cigerattes per day  Vaping Use  . Vaping Use: Never used  Substance Use Topics  . Alcohol use: No    Review of Systems Constitutional: No fever. Eyes: No visual changes. ENT: No sore throat. Cardiovascular: Denies chest pain. Respiratory: Denies shortness of breath. Gastrointestinal: No nausea, vomiting, diarrhea. Genitourinary: Negative for dysuria. Musculoskeletal: Negative for back pain. Skin: Negative for rash. Neurological: Negative for focal weakness or numbness.  ____________________________________________   PHYSICAL EXAM:  VITAL SIGNS: ED Triage Vitals  Enc Vitals Group     BP 10/22/20 2355 102/73     Pulse Rate 10/22/20 2355 89     Resp 10/22/20 2355 18     Temp 10/22/20 2355 98.4 F (36.9 C)     Temp Source 10/22/20 2355 Oral     SpO2 10/22/20 2355 99 %     Weight 10/22/20  2354 250 lb (113.4 kg)     Height 10/22/20 2354 5\' 9"  (1.753 m)     Head Circumference --      Peak Flow --      Pain Score 10/22/20 2353 9     Pain Loc --      Pain Edu? --      Excl. in GC? --    CONSTITUTIONAL: Alert and oriented and responds appropriately to questions. Well-appearing; well-nourished HEAD: Normocephalic EYES: Conjunctivae clear, pupils appear equal, EOM appear intact ENT: normal nose; moist mucous membranes NECK: Supple, normal ROM CARD: RRR; S1 and S2 appreciated; no murmurs, no clicks, no rubs, no gallops RESP: Normal chest excursion without splinting or tachypnea; breath sounds  clear and equal bilaterally; no wheezes, no rhonchi, no rales, no hypoxia or respiratory distress, speaking full sentences ABD/GI: Normal bowel sounds; non-distended; soft, non-tender, no rebound, no guarding, no peritoneal signs, no hepatosplenomegaly RECTAL:  Normal rectal tone, no gross blood or melena, guaiac negative, no hemorrhoids appreciated, nontender rectal exam, no fecal impaction.  BACK: The back appears normal, no midline step-off or deformity, diffusely tender throughout the lower back EXT: Normal ROM in all joints; no deformity noted, no edema; no cyanosis SKIN: Normal color for age and race; warm; no rash on exposed skin NEURO: Moves all extremities equally, no clonus, deep tendon reflexes in bilateral lower extremities normal, sensation to light touch intact diffusely, no saddle anesthesia, difficult to assess strength due to patient's lower back pain PSYCH: The patient's mood and manner are appropriate.  ____________________________________________   LABS (all labs ordered are listed, but only abnormal results are displayed)  Labs Reviewed  CBC WITH DIFFERENTIAL/PLATELET - Abnormal; Notable for the following components:      Result Value   RBC 2.50 (*)    Hemoglobin 5.4 (*)    HCT 19.5 (*)    MCV 78.0 (*)    MCH 21.6 (*)    MCHC 27.7 (*)    RDW 20.4 (*)    nRBC 0.5 (*)    All other components within normal limits  BASIC METABOLIC PANEL - Abnormal; Notable for the following components:   Chloride 113 (*)    Glucose, Bld 116 (*)    Calcium 8.8 (*)    All other components within normal limits  RESP PANEL BY RT-PCR (FLU A&B, COVID) ARPGX2  URINALYSIS, COMPLETE (UACMP) WITH MICROSCOPIC  PROTIME-INR  TYPE AND SCREEN  PREPARE RBC (CROSSMATCH)   ____________________________________________  EKG   EKG Interpretation  Date/Time:  Thursday Oct 23 2020 05:03:44 EDT Ventricular Rate:  72 PR Interval:  162 QRS Duration: 113 QT Interval:  407 QTC Calculation: 446 R  Axis:   80 Text Interpretation: Sinus rhythm Borderline intraventricular conduction delay Baseline wander in lead(s) III Confirmed by [id number] [phone]) on 10/23/2020 5:13:48 AM       ____________________________________________  RADIOLOGY 12/23/2020 Candelaria Pies, personally viewed and evaluated these images (plain radiographs) as part of my medical decision making, as well as reviewing the written report by the radiologist.  ED MD interpretation:    Official radiology report(s): No results found.  ____________________________________________   PROCEDURES  Procedure(s) performed (including Critical Care):  Procedures  CRITICAL CARE Performed by: Normajean Baxter   Total critical care time: 45 minutes  Critical care time was exclusive of separately billable procedures and treating other patients.  Critical care was necessary to treat or prevent imminent or life-threatening deterioration.  Critical care was time spent personally by me on  the following activities: development of treatment plan with patient and/or surrogate as well as nursing, discussions with consultants, evaluation of patient's response to treatment, examination of patient, obtaining history from patient or surrogate, ordering and performing treatments and interventions, ordering and review of laboratory studies, ordering and review of radiographic studies, pulse oximetry and re-evaluation of patient's condition.  ____________________________________________   INITIAL IMPRESSION / ASSESSMENT AND PLAN / ED COURSE  As part of my medical decision making, I reviewed the following data within the electronic MEDICAL RECORD NUMBER Nursing notes reviewed and incorporated, Labs reviewed , EKG interpreted , Old EKG reviewed, Old chart reviewed, Discussed with admitting physician  and Notes from prior ED visits         Patient here with multiple complaints.  Complaining mostly of back pain that radiates down both legs.  Suspect  radiculopathy.  No red flag symptoms to suggest cauda equina, epidural abscess or hematoma, discitis or osteomyelitis, transverse myelitis, spinal stenosis, fracture.  Will treat with Toradol, Decadron and reassess.  Also complaining of vertigo for the past couple of weeks worse with changing of position.  Will give meclizine and reassess.  Unable to test gait at this time due to her back pain.  Patient also complains of generalized weakness.  Will ensure no anemia, electrolyte derangement, UTI today.  Will obtain EKG.  She denies any chest pain or shortness of breath.  No fevers, cough, vomiting or diarrhea.  ED PROGRESS  Patient's labs show a hemoglobin of 5.4.  She denies having any current abdominal pain, bloody stools, melena.  She is postmenopausal.  Denies vaginal bleeding.  She is guaiac negative here.  States she has a known history of iron deficiency anemia and does take iron intermittently but not regularly.  Has never had a blood transfusion.  I have recommended that she be transfused today given she is having fatigue, generalized weakness, dizziness.  No chest pain or shortness of breath.  She agrees with this plan.  Will discuss with medicine for admission.  6:15 AM Discussed patient's case with hospitalist, Dr. Arville Care.  I have recommended admission and patient (and family if present) agree with this plan. Admitting physician will place admission orders.   I reviewed all nursing notes, vitals, pertinent previous records and reviewed/interpreted all EKGs, lab and urine results, imaging (as available).   ____________________________________________   FINAL CLINICAL IMPRESSION(S) / ED DIAGNOSES  Final diagnoses:  Symptomatic anemia  Acute bilateral low back pain with bilateral sciatica     ED Discharge Orders    None      *Please note:  SAMARRA RIDGELY was evaluated in Emergency Department on 10/23/2020 for the symptoms described in the history of present illness. She was  evaluated in the context of the global COVID-19 pandemic, which necessitated consideration that the patient might be at risk for infection with the SARS-CoV-2 virus that causes COVID-19. Institutional protocols and algorithms that pertain to the evaluation of patients at risk for COVID-19 are in a state of rapid change based on information released by regulatory bodies including the CDC and federal and state organizations. These policies and algorithms were followed during the patient's care in the ED.  Some ED evaluations and interventions may be delayed as a result of limited staffing during and the pandemic.*   Note:  This document was prepared using Dragon voice recognition software and may include unintentional dictation errors.   Jamisen Hawes, Layla Maw, DO 10/23/20 [phone]

## 2020-10-23 NOTE — ED Notes (Addendum)
Ambulated with 1 person assist to toilet in room; provided urine sample--sent to lab. Pt was given phone to call and update family.

## 2020-10-23 NOTE — ED Notes (Signed)
Jacque RN aware of assigned bed ?

## 2020-10-24 ENCOUNTER — Inpatient Hospital Stay: Payer: Self-pay | Admitting: Anesthesiology

## 2020-10-24 ENCOUNTER — Encounter
Admission: EM | Disposition: A | Discharge status: Home / Self Care | Payer: Self-pay | Source: Home / Self Care | Attending: Internal Medicine

## 2020-10-24 ENCOUNTER — Inpatient Hospital Stay: Payer: Self-pay

## 2020-10-24 DIAGNOSIS — K5520 Angiodysplasia of colon without hemorrhage: Secondary | ICD-10-CM

## 2020-10-24 DIAGNOSIS — D649 Anemia, unspecified: Secondary | ICD-10-CM

## 2020-10-24 DIAGNOSIS — M5431 Sciatica, right side: Secondary | ICD-10-CM

## 2020-10-24 DIAGNOSIS — E876 Hypokalemia: Secondary | ICD-10-CM

## 2020-10-24 DIAGNOSIS — K253 Acute gastric ulcer without hemorrhage or perforation: Secondary | ICD-10-CM

## 2020-10-24 DIAGNOSIS — K3189 Other diseases of stomach and duodenum: Secondary | ICD-10-CM

## 2020-10-24 HISTORY — PX: ESOPHAGOGASTRODUODENOSCOPY: SHX5428

## 2020-10-24 LAB — CBC
HCT: 24 % — ABNORMAL LOW (ref 36.0–46.0)
HCT: 24.1 % — ABNORMAL LOW (ref 36.0–46.0)
HCT: 25.1 % — ABNORMAL LOW (ref 36.0–46.0)
Hemoglobin: 7.2 g/dL — ABNORMAL LOW (ref 12.0–15.0)
Hemoglobin: 7.2 g/dL — ABNORMAL LOW (ref 12.0–15.0)
Hemoglobin: 7.4 g/dL — ABNORMAL LOW (ref 12.0–15.0)
MCH: 23.4 pg — ABNORMAL LOW (ref 26.0–34.0)
MCH: 23.7 pg — ABNORMAL LOW (ref 26.0–34.0)
MCH: 24 pg — ABNORMAL LOW (ref 26.0–34.0)
MCHC: 29.5 g/dL — ABNORMAL LOW (ref 30.0–36.0)
MCHC: 29.9 g/dL — ABNORMAL LOW (ref 30.0–36.0)
MCHC: 30 g/dL (ref 30.0–36.0)
MCV: 78.9 fL — ABNORMAL LOW (ref 80.0–100.0)
MCV: 79.4 fL — ABNORMAL LOW (ref 80.0–100.0)
MCV: 80.3 fL (ref 80.0–100.0)
Platelets: 293 10*3/uL (ref 150–400)
Platelets: 295 10*3/uL (ref 150–400)
Platelets: 301 10*3/uL (ref 150–400)
RBC: 3 MIL/uL — ABNORMAL LOW (ref 3.87–5.11)
RBC: 3.04 MIL/uL — ABNORMAL LOW (ref 3.87–5.11)
RBC: 3.16 MIL/uL — ABNORMAL LOW (ref 3.87–5.11)
RDW: 18.6 % — ABNORMAL HIGH (ref 11.5–15.5)
RDW: 19.1 % — ABNORMAL HIGH (ref 11.5–15.5)
RDW: 19.2 % — ABNORMAL HIGH (ref 11.5–15.5)
WBC: 6.1 10*3/uL (ref 4.0–10.5)
WBC: 6.2 10*3/uL (ref 4.0–10.5)
WBC: 6.4 10*3/uL (ref 4.0–10.5)
nRBC: 1.3 % — ABNORMAL HIGH (ref 0.0–0.2)
nRBC: 1.4 % — ABNORMAL HIGH (ref 0.0–0.2)
nRBC: 1.8 % — ABNORMAL HIGH (ref 0.0–0.2)

## 2020-10-24 LAB — BASIC METABOLIC PANEL
Anion gap: 10 (ref 5–15)
BUN: 8 mg/dL (ref 6–20)
CO2: 19 mmol/L — ABNORMAL LOW (ref 22–32)
Calcium: 8.6 mg/dL — ABNORMAL LOW (ref 8.9–10.3)
Chloride: 111 mmol/L (ref 98–111)
Creatinine, Ser: 0.77 mg/dL (ref 0.44–1.00)
GFR, Estimated: >60 mL/min (ref >60)
Glucose, Bld: 171 mg/dL — ABNORMAL HIGH (ref 70–99)
Potassium: 3.3 mmol/L — ABNORMAL LOW (ref 3.5–5.1)
Sodium: 140 mmol/L (ref 135–145)

## 2020-10-24 LAB — BPAM RBC
Blood Product Expiration Date: 202205122359
Blood Product Expiration Date: 202205132359
ISSUE DATE / TIME: 202205050816
ISSUE DATE / TIME: 202205051410
Unit Type and Rh: 600
Unit Type and Rh: 600

## 2020-10-24 LAB — TYPE AND SCREEN
ABO/RH(D): A POS
Antibody Screen: NEGATIVE
Unit division: 0
Unit division: 0

## 2020-10-24 LAB — GLUCOSE, CAPILLARY: Glucose-Capillary: 116 mg/dL — ABNORMAL HIGH (ref 70–99)

## 2020-10-24 SURGERY — EGD (ESOPHAGOGASTRODUODENOSCOPY)
Anesthesia: General

## 2020-10-24 MED ORDER — LIDOCAINE HCL (PF) 2 % IJ SOLN
INTRAMUSCULAR | Status: AC
  Filled 2020-10-24: qty 5

## 2020-10-24 MED ORDER — POTASSIUM CHLORIDE 10 MEQ/100ML IV SOLN
10.0000 meq | INTRAVENOUS | Status: AC
  Administered 2020-10-24 (×2): 10 meq via INTRAVENOUS
  Filled 2020-10-24: qty 100

## 2020-10-24 MED ORDER — LIDOCAINE 5 % EX PTCH
1.0000 | MEDICATED_PATCH | CUTANEOUS | Status: DC
  Administered 2020-10-24 – 2020-10-25 (×2): 1 via TRANSDERMAL
  Filled 2020-10-24 (×2): qty 1

## 2020-10-24 MED ORDER — CYANOCOBALAMIN 1000 MCG/ML IJ SOLN
1000.0000 ug | Freq: Once | INTRAMUSCULAR | Status: AC
  Administered 2020-10-25: 16:00:00 1000 ug via INTRAMUSCULAR
  Filled 2020-10-24 (×2): qty 1

## 2020-10-24 MED ORDER — TRAMADOL HCL 50 MG PO TABS
50.0000 mg | ORAL_TABLET | Freq: Four times a day (QID) | ORAL | Status: DC | PRN
  Administered 2020-10-24 – 2020-10-25 (×4): 50 mg via ORAL
  Filled 2020-10-24 (×4): qty 1

## 2020-10-24 MED ORDER — PROPOFOL 500 MG/50ML IV EMUL
INTRAVENOUS | Status: AC
  Filled 2020-10-24: qty 50

## 2020-10-24 MED ORDER — MUSCLE RUB 10-15 % EX CREA
TOPICAL_CREAM | CUTANEOUS | Status: DC | PRN
  Administered 2020-10-24: 1 via TOPICAL
  Filled 2020-10-24: qty 85

## 2020-10-24 MED ORDER — PROPOFOL 500 MG/50ML IV EMUL
INTRAVENOUS | Status: DC | PRN
  Administered 2020-10-24: 150 ug/kg/min via INTRAVENOUS

## 2020-10-24 MED ORDER — LIDOCAINE 2% (20 MG/ML) 5 ML SYRINGE
INTRAMUSCULAR | Status: DC | PRN
  Administered 2020-10-24: 100 mg via INTRAVENOUS

## 2020-10-24 MED ORDER — SODIUM CHLORIDE 0.9 % IV SOLN
300.0000 mg | Freq: Once | INTRAVENOUS | Status: DC
  Filled 2020-10-24: qty 15

## 2020-10-24 MED ORDER — CYANOCOBALAMIN 1000 MCG/ML IJ SOLN
1000.0000 ug | Freq: Once | INTRAMUSCULAR | Status: AC
  Administered 2020-10-24: 14:00:00 1000 ug via INTRAMUSCULAR
  Filled 2020-10-24: qty 1

## 2020-10-24 MED ORDER — DOCUSATE SODIUM 100 MG PO CAPS
100.0000 mg | ORAL_CAPSULE | Freq: Two times a day (BID) | ORAL | Status: DC
  Administered 2020-10-25 (×2): 100 mg via ORAL
  Filled 2020-10-24 (×2): qty 1

## 2020-10-24 MED ORDER — GLYCOPYRROLATE 0.2 MG/ML IJ SOLN
INTRAMUSCULAR | Status: AC
  Filled 2020-10-24: qty 1

## 2020-10-24 MED ORDER — SENNA 8.6 MG PO TABS
1.0000 | ORAL_TABLET | Freq: Every day | ORAL | Status: DC
  Administered 2020-10-25: 8.6 mg via ORAL
  Filled 2020-10-24: qty 1

## 2020-10-24 MED ORDER — GLYCOPYRROLATE 0.2 MG/ML IJ SOLN
INTRAMUSCULAR | Status: DC | PRN
  Administered 2020-10-24: .2 mg via INTRAVENOUS

## 2020-10-24 MED ORDER — PROPOFOL 10 MG/ML IV BOLUS
INTRAVENOUS | Status: DC | PRN
  Administered 2020-10-24: 50 mg via INTRAVENOUS
  Administered 2020-10-24: 100 mg via INTRAVENOUS

## 2020-10-24 MED ORDER — ENSURE ENLIVE PO LIQD
237.0000 mL | Freq: Two times a day (BID) | ORAL | Status: DC
  Administered 2020-10-25 (×2): 237 mL via ORAL

## 2020-10-24 NOTE — Progress Notes (Signed)
Patient complaining of mid abdominal cramping pain that "feels like gas pain or even hunger pain".  Patient's vital signs are stable and within normal limits; abdomen is soft and non-distended.  Patient tolerated ginger ale without complaints of nausea or increasing abdominal pain.  Encouraged patient to continue to lie on her left side and once she returns to her room, she states "I am getting up to the bathroom".  Transporter to return patient to her room via bed.  Will notify her RN of new complaints.

## 2020-10-24 NOTE — Progress Notes (Signed)
[facility] service will sign off. Please page on-call GI with any signs of active GI bleeding, worsening anemia or any questions or concerns. Follow up in GI clinic in 4 to 6 weeks

## 2020-10-24 NOTE — Plan of Care (Addendum)
Shift Summary: Pt orientedx4, VSS, remains on RA. IV faraheme infusion completed w/o issue. Pain addressed with PRN fentanyl, tylenol, and riboxan utilized per [facility]. Continues to endorse shooting pain from right hip down to foot. NPO at midnight per orders, pt aware. Independent to [facility] within room. Fall/safety precautions in place, rounding performed, needs/concerns addressed during shift.   0630 - Report given to endoscopy, signed consent present in chart. Pt's questions answered.  Problem: Education: Goal: Knowledge of General Education information will improve Description: Including pain rating scale, medication(s)/side effects and non-pharmacologic comfort measures Outcome: Progressing   Problem: Health Behavior/Discharge Planning: Goal: Ability to manage health-related needs will improve Outcome: Progressing   Problem: Clinical Measurements: Goal: Ability to maintain clinical measurements within normal limits will improve Outcome: Progressing Goal: Will remain free from infection Outcome: Progressing Goal: Diagnostic test results will improve Outcome: Progressing Goal: Respiratory complications will improve Outcome: Progressing Goal: Cardiovascular complication will be avoided Outcome: Progressing   Problem: Activity: Goal: Risk for activity intolerance will decrease Outcome: Progressing   Problem: Nutrition: Goal: Adequate nutrition will be maintained Outcome: Progressing   Problem: Coping: Goal: Level of anxiety will decrease Outcome: Progressing   Problem: Elimination: Goal: Will not experience complications related to bowel motility Outcome: Progressing Goal: Will not experience complications related to urinary retention Outcome: Progressing   Problem: Pain Managment: Goal: General experience of comfort will improve Outcome: Progressing   Problem: Safety: Goal: Ability to remain free from injury will improve Outcome: Progressing   Problem: Skin  Integrity: Goal: Risk for impaired skin integrity will decrease Outcome: Progressing

## 2020-10-24 NOTE — Progress Notes (Signed)
Melodie Bouillon, MD [address], [address], [place], [place], [address] [address], [address], [place], [place], [address] Phone: [phone]  Fax: [phone]   Subjective: Patient denies any abdominal pain, nausea or vomiting, melena or hematochezia.   Objective: Exam: Vital signs in last 24 hours: Vitals:   10/23/20 1949 10/23/20 2331 10/24/20 0504 10/24/20 0744  BP: (!) 148/67 (!) 142/60 (!) 130/57 (!) 140/59  Pulse: 79 83 75 72  Resp: 18 16 20 20   Temp: 98.4 F (36.9 C) 98.4 F (36.9 C) 98.3 F (36.8 C) (!) 97.3 F (36.3 C)  TempSrc:  Oral  Temporal  SpO2: 100% 99% 96% 99%  Weight:      Height:       Weight change:   Intake/Output Summary (Last 24 hours) at 10/24/2020 0810 Last data filed at 10/24/2020 0300 Gross per 24 hour  Intake 1747.26 ml  Output --  Net 1747.26 ml    General: No acute distress, AAO x3 Abd: Soft, NT/ND, No HSM Skin: Warm, no rashes Neck: Supple, Trachea midline   Lab Results: Lab Results  Component Value Date   WBC 6.4 10/24/2020   HGB 7.2 (L) 10/24/2020   HCT 24.1 (L) 10/24/2020   MCV 80.3 10/24/2020   PLT 301 10/24/2020   Micro Results: Recent Results (from the past 240 hour(s))  Resp Panel by RT-PCR (Flu A&B, Covid) Nasopharyngeal Swab     Status: None   Collection Time: 10/23/20  5:55 AM   Specimen: Nasopharyngeal Swab; Nasopharyngeal(NP) swabs in vial transport medium  Result Value Ref Range Status   SARS Coronavirus 2 by RT PCR NEGATIVE NEGATIVE Final    Comment: (NOTE) SARS-CoV-2 target nucleic acids are NOT DETECTED.  The SARS-CoV-2 RNA is generally detectable in upper respiratory specimens during the acute phase of infection. The lowest concentration of SARS-CoV-2 viral copies this assay can detect is 138 copies/mL. A negative result does not preclude SARS-Cov-2 infection and should not be used as the sole basis for treatment or other patient management decisions. A negative result may occur with  improper  specimen collection/handling, submission of specimen other than nasopharyngeal swab, presence of viral mutation(s) within the areas targeted by this assay, and inadequate number of viral copies(<138 copies/mL). A negative result must be combined with clinical observations, patient history, and epidemiological information. The expected result is Negative.  Fact Sheet for Patients:  12/23/20  Fact Sheet for Healthcare Providers:  BloggerCourse.com  This test is no t yet approved or cleared by the [employer] FDA and  has been authorized for detection and/or diagnosis of SARS-CoV-2 by FDA under an Emergency Use Authorization (EUA). This EUA will remain  in effect (meaning this test can be used) for the duration of the COVID-19 declaration under Section 564(b)(1) of the Act, 21 U.S.C.section 360bbb-3(b)(1), unless the authorization is terminated  or revoked sooner.       Influenza A by PCR NEGATIVE NEGATIVE Final   Influenza B by PCR NEGATIVE NEGATIVE Final    Comment: (NOTE) The Xpert Xpress SARS-CoV-2/FLU/RSV plus assay is intended as an aid in the diagnosis of influenza from Nasopharyngeal swab specimens and should not be used as a sole basis for treatment. Nasal washings and aspirates are unacceptable for Xpert Xpress SARS-CoV-2/FLU/RSV testing.  Fact Sheet for Patients: [place]  Fact Sheet for Healthcare Providers: BloggerCourse.com  This test is not yet approved or cleared by the [employer]ious[employer]ker.[employer] FDA and has been authorized for detection and/or diagnosis of SARS-CoV-2 by FDA  under an Emergency Use Authorization (EUA). This EUA will remain in effect (meaning this test can be used) for the duration of the COVID-19 declaration under Section 564(b)(1) of the Act, 21 U.S.C. section 360bbb-3(b)(1), unless the authorization is terminated or revoked.  Performed at  [facility], [address] [place], [place] [address]    Studies/Results: No results found. Medications:  Scheduled Meds: . cyanocobalamin  1,000 mcg Intramuscular Once  . [MAR Hold] ferrous sulfate  325 mg Oral TID WC  . [MAR Hold] nicotine  21 mg Transdermal Daily  . [MAR Hold] pantoprazole (PROTONIX) IV  40 mg Intravenous Q12H   Continuous Infusions: . sodium chloride 20 mL/hr (10/24/20 0749)  . iron sucrose    . potassium chloride     PRN Meds:.[MAR Hold] acetaminophen, [MAR Hold] fentaNYL (SUBLIMAZE) injection, [MAR Hold] meclizine, [MAR Hold] methocarbamol, [MAR Hold] ondansetron (ZOFRAN) IV   Assessment: Principal Problem:   Symptomatic anemia Active Problems:   Iron deficiency anemia   Tobacco abuse   Sciatica   GERD (gastroesophageal reflux disease)   Dizziness    Plan: Lab work consistent with iron deficiency anemia Would recommend IV iron transfusion while as an inpatient  Given gastric thickening seen on CT scans on multiple occasions, proceed with upper endoscopy for further evaluation and rule out gastric cancer causing her acute on chronic iron deficiency anemia  If EGD is negative, patient will likely need colonoscopy  I have discussed alternative options, risks & benefits,  which include, but are not limited to, bleeding, infection, perforation,respiratory complication & drug reaction.  The patient agrees with this plan & written consent will be obtained.      LOS: 1 day   Melodie Bouillon, MD 10/24/2020, 8:10 AM

## 2020-10-24 NOTE — Anesthesia Preprocedure Evaluation (Signed)
Anesthesia Evaluation  Patient identified by MRN, date of birth, ID band Patient awake    Reviewed: Allergy & Precautions, H&P , NPO status , Patient's Chart, lab work & pertinent test results, reviewed documented beta blocker date and time   Airway Mallampati: II   Neck ROM: full    Dental  (+) Poor Dentition   Pulmonary neg pulmonary ROS, Current Smoker,    Pulmonary exam normal        Cardiovascular Exercise Tolerance: Poor negative cardio ROS Normal cardiovascular exam Rhythm:regular Rate:Normal     Neuro/Psych PSYCHIATRIC DISORDERS Depression  Neuromuscular disease    GI/Hepatic GERD  Medicated,(+) Hepatitis -  Endo/Other  negative endocrine ROS  Renal/GU negative Renal ROS  negative genitourinary   Musculoskeletal   Abdominal   Peds  Hematology  (+) Blood dyscrasia, anemia ,   Anesthesia Other Findings Past Medical History: No date: Atopic dermatitis No date: Constipation No date: Depression No date: GERD (gastroesophageal reflux disease) No date: Hepatitis C No date: Sciatica No date: Tobacco use Past Surgical History: 05/22/2002: paraguard IUD surgery BMI    Body Mass Index: 36.92 kg/m     Reproductive/Obstetrics negative OB ROS                             Anesthesia Physical Anesthesia Plan  ASA: III and emergent  Anesthesia Plan: General   Post-op Pain Management:    Induction:   PONV Risk Score and Plan:   Airway Management Planned:   Additional Equipment:   Intra-op Plan:   Post-operative Plan:   Informed Consent: I have reviewed the patients History and Physical, chart, labs and discussed the procedure including the risks, benefits and alternatives for the proposed anesthesia with the patient or authorized representative who has indicated his/her understanding and acceptance.     Dental Advisory Given  Plan Discussed with: CRNA  Anesthesia Plan  Comments:         Anesthesia Quick Evaluation

## 2020-10-24 NOTE — TOC Initial Note (Signed)
Transition of Care [facility]) - Initial/Assessment Note    Patient Details  Name: Morgan Hicks MRN: [id number] Date of Birth: 11-10-1965  Transition of Care [facility]) CM/SW Contact:    Shelbie Hutching, RN Phone Number: 10/24/2020, 12:51 PM  Clinical Narrative:                 Patient admitted to the hospital with symptomatic anemia requiring blood transfusion.  Patient had EGD completed today.   RNCM met with patient at the bedside.  Patient is from home where she lives along and is independent, patient drives.  She is current with [facility] and also gets her prescriptions from there.   Patient does not currently have insurance, RNCM provided patient with information on Financial assistance through [facility] and the application for financial assistance.   Patient has no other needs or questions at this time.   Expected Discharge Plan: Home/Self Care Barriers to Discharge: Continued Medical Work up   Patient Goals and CMS Choice Patient states their goals for this hospitalization and ongoing recovery are:: Wants to feel better      Expected Discharge Plan and Services Expected Discharge Plan: Home/Self Care   Discharge Planning Services: CM Consult   Living arrangements for the past 2 months: Apartment                 DME Arranged: N/A DME Agency: NA       HH Arranged: NA HH Agency: NA        Prior Living Arrangements/Services Living arrangements for the past 2 months: Apartment Lives with:: Self Patient language and need for interpreter reviewed:: Yes Do you feel safe going back to the place where you live?: Yes      Need for Family Participation in Patient Care: No (Comment) Care giver support system in place?: Yes (comment) (states good support system- family close by)   Criminal Activity/Legal Involvement Pertinent to Current Situation/Hospitalization: No - Comment as needed  Activities of Daily Living Home Assistive Devices/Equipment: None ADL Screening (condition at  time of admission) Patient's cognitive ability adequate to safely complete daily activities?: Yes Is the patient deaf or have difficulty hearing?: No Does the patient have difficulty seeing, even when wearing glasses/contacts?: No Does the patient have difficulty concentrating, remembering, or making decisions?: No Patient able to express need for assistance with ADLs?: Yes Does the patient have difficulty dressing or bathing?: No Independently performs ADLs?: Yes (appropriate for developmental age) Does the patient have difficulty walking or climbing stairs?: Yes Weakness of Legs: Both Weakness of Arms/Hands: Both  Permission Sought/Granted                  Emotional Assessment Appearance:: Appears stated age Attitude/Demeanor/Rapport: Engaged Affect (typically observed): Accepting Orientation: : Oriented to Self,Oriented to Place,Oriented to  Time,Oriented to Situation Alcohol / Substance Use: Not Applicable Psych Involvement: No (comment)  Admission diagnosis:  Anemia [D64.9] Symptomatic anemia [D64.9] Acute bilateral low back pain with bilateral sciatica [M54.42, M54.41] Patient Active Problem List   Diagnosis Date Noted  . Acute gastric erosion   . Gastric nodule   . AVM (arteriovenous malformation) of small bowel, acquired   . Symptomatic anemia 10/23/2020  . Iron deficiency anemia 10/23/2020  . Tobacco abuse 10/23/2020  . Sciatica 10/23/2020  . GERD (gastroesophageal reflux disease)   . Dizziness   . Microcytic anemia 02/24/2018   PCP:  Denton Lank, MD Pharmacy:   [facility] [place] - [address] [place] ([place]) [place] [address] Phone: [phone] Fax: [place], [address] [place] [address] Phone: [phone] Fax: [phone]     Social Determinants of Health (SDOH) Interventions    Readmission Risk  Interventions No flowsheet data found.

## 2020-10-24 NOTE — Op Note (Signed)
[facility] Gastroenterology Patient Name: Morgan Hicks Procedure Date: 10/24/2020 8:42 AM MRN: [id number] Account #: [id number] Date of Birth: 1966/04/27 Admit Type: Inpatient Age: 55 Room: [facility] ENDO ROOM 2 Gender: Female Note Status: Finalized Procedure:             Upper GI endoscopy Indications:           Iron deficiency anemia Providers:             Atif Chapple B. Maximino Greenland MD, MD Referring MD:          Hillery Aldo MD, MD (Referring MD) Medicines:             Monitored Anesthesia Care Complications:         No immediate complications. Procedure:             Pre-Anesthesia Assessment:                        - The risks and benefits of the procedure and the                         sedation options and risks were discussed with the                         patient. All questions were answered and informed                         consent was obtained.                        - Patient identification and proposed procedure were                         verified prior to the procedure.                        - ASA Grade Assessment: II - A patient with mild                         systemic disease.                        After obtaining informed consent, the endoscope was                         passed under direct vision. Throughout the procedure,                         the patient's blood pressure, pulse, and oxygen                         saturations were monitored continuously. The Endoscope                         was introduced through the mouth, and advanced to the                         second part of duodenum. The upper GI endoscopy was                         accomplished  with ease. The patient tolerated the                         procedure well. Findings:      LA Grade B (one or more mucosal breaks greater than 5 mm, not extending       between the tops of two mucosal folds) esophagitis with no bleeding was       found in the distal esophagus.      The exam  of the esophagus was otherwise normal.      Multiple 4 to 5 mm erosions with no bleeding and no stigmata of recent       bleeding were found in the gastric antrum. Biopsies were taken with a       cold forceps for histology.      Patchy nodular mucosa was found in the gastric fundus and in the gastric       body. Biopsies were taken with a cold forceps for histology.      A medium-sized hiatal hernia was present.      There is no endoscopic evidence of mass in the entire examined stomach.      A single 4 mm angioectasia without bleeding was found in the second       portion of the duodenum. Coagulation for bleeding prevention using argon       plasma was successful.      Patchy moderately congested mucosa without active bleeding and with no       stigmata of bleeding was found in the duodenal bulb.      The exam of the duodenum was otherwise normal. Impression:            - LA Grade B reflux esophagitis with no bleeding.                        - Erosive gastropathy with no bleeding and no stigmata                         of recent bleeding. Biopsied.                        - Nodular mucosa in the gastric fundus and in the                         gastric body. Biopsied.                        - Medium-sized hiatal hernia.                        - A single non-bleeding angioectasia in the duodenum.                         Treated with argon plasma coagulation (APC).                        - Congested duodenal mucosa. Recommendation:        - IV Iron replacement as an inpatient and follow up                         with hematology as an outpatient of IV iron  replacement as needed                        -Follow up in GI clinic in 4-6 weeks to discuss                         colonoscopy. Without any signs of active GI bleeding                         and findings as above on upper endoscopy today, no                         indication for urgent colonoscopy as an  inpatient                         unless anemia worsens or active bleeding occurs                        - Avoid NSAIDs except Aspirin if medically indicated                         by PCP                        - Use Protonix (pantoprazole) 40 mg PO BID for 4 weeks.                        - The findings and recommendations were discussed with                         the patient.                        - Advance diet as tolerated. Procedure Code(s):     --- Professional ---                        [phone], 59, Esophagogastroduodenoscopy, flexible,                         transoral; with control of bleeding, any method                        43239, Esophagogastroduodenoscopy, flexible,                         transoral; with biopsy, single or multiple Diagnosis Code(s):     --- Professional ---                        K21.00, Gastro-esophageal reflux disease with                         esophagitis, without bleeding                        K31.89, Other diseases of stomach and duodenum                        K44.9, Diaphragmatic hernia without obstruction or  gangrene                        K31.819, Angiodysplasia of stomach and duodenum                         without bleeding                        D50.9, Iron deficiency anemia, unspecified CPT copyright 2019 American Medical Association. All rights reserved. The codes documented in this report are preliminary and upon coder review may  be revised to meet current compliance requirements.  Melodie Bouillon, MD Michel Bickers B. Maximino Greenland MD, MD 10/24/2020 9:23:54 AM This report has been signed electronically. Number of Addenda: 0 Note Initiated On: 10/24/2020 8:42 AM Estimated Blood Loss:  Estimated blood loss: none.      [facility]

## 2020-10-24 NOTE — Transfer of Care (Signed)
Immediate Anesthesia Transfer of Care Note  Patient: Morgan Hicks  Procedure(s) Performed: ESOPHAGOGASTRODUODENOSCOPY (EGD) (N/A )  Patient Location: Endoscopy Unit  Anesthesia Type:General  Level of Consciousness: awake, alert  and oriented  Airway & Oxygen Therapy: Patient Spontanous Breathing  Post-op Assessment: Post -op Vital signs reviewed and stable  Post vital signs: stable  Last Vitals:  Vitals Value Taken Time  BP 126/76 10/24/20 0913  Temp    Pulse 96 10/24/20 0913  Resp 13 10/24/20 0913  SpO2 96 % 10/24/20 0913  Vitals shown include unvalidated device data.  Last Pain:  Vitals:   10/24/20 0800  TempSrc:   PainSc: 3       Patients Stated Pain Goal: 0 (10/23/20 1949)  Complications: No complications documented.

## 2020-10-24 NOTE — Progress Notes (Signed)
Initial Nutrition Assessment  DOCUMENTATION CODES:  Not applicable  INTERVENTION:   Continue current diet as ordered  Ensure Enlive po BID, each supplement provides 350 kcal and 20 grams of protein  NUTRITION DIAGNOSIS:  Inadequate oral intake related to poor appetite as evidenced by per patient/family report.  GOAL:  Patient will meet greater than or equal to 90% of their needs  MONITOR:  PO intake,Supplement acceptance  REASON FOR ASSESSMENT:  Malnutrition Screening Tool    ASSESSMENT:  Pt presented to ED 5/4 with dizziness and worsening lower back pain over the last two weeks. In ED, pt was found to have hemoglobin 5.4 and GI was consulted to determine source of bleeding. PMH includes sciatica, HCV, GERD, depression, tobacco abuse, iron deficiency anemia.  Pt resting in bed at the time of visit. Pt reports that she did eat some of her lunch and is tolerating well so far after her procedure. Also endorses feeling better overall from when she came in. Pt reports that for the last month she has felt very poorly and has been eating very little. Prior to this episode, states appetite had always been good. Pt reports she spent the majority of the last month in bed due to her back pain. Has noticed significant weight loss. Bed weight obtained and showing 98.7kg. Pt agreeable to a nutrition supplement until intake returns to normal.   5/6 - EGD, findings: esophagitis, erosive gastropathy, nodular mucosa in the gastric fundus and in the gastric body, medium-sized hiatal hernia, a single non-bleeding angioectasia in the duodenum, congested duodenal mucosa.  Relevant Scheduled Meds: . cyanocobalamin  1,000 mcg Intramuscular Once  . ferrous sulfate  325 mg Oral TID WC  . pantoprazole (PROTONIX) IV  40 mg Intravenous Q12H   Relevant PRN Meds: ondansetron  Labs reviewed:  K 3.3  NUTRITION - FOCUSED PHYSICAL EXAM: Flowsheet Row Most Recent Value  Orbital Region No depletion  Upper  Arm Region No depletion  Thoracic and Lumbar Region No depletion  Buccal Region No depletion  Temple Region Mild depletion  Clavicle Bone Region Mild depletion  Clavicle and Acromion Bone Region Mild depletion  Scapular Bone Region No depletion  Dorsal Hand No depletion  Patellar Region No depletion  Anterior Thigh Region No depletion  Posterior Calf Region No depletion  Edema (RD Assessment) None  Hair Reviewed  Eyes Reviewed  Mouth Reviewed  Skin Reviewed  Nails Reviewed     Diet Order:   Diet Order            Diet regular Room service appropriate? Yes; Fluid consistency: Thin  Diet effective now                 EDUCATION NEEDS:  No education needs have been identified at this time  Skin:  Skin Assessment: Reviewed RN Assessment  Last BM:  5/5 per RN documentation  Height:  Ht Readings from Last 1 Encounters:  10/22/20 5\' 9"  (1.753 m)    Weight:  Wt Readings from Last 1 Encounters:  10/24/20 98.7 kg    Ideal Body Weight:  65.9 kg  BMI:  Body mass index is 32.13 kg/m.  Estimated Nutritional Needs:   Kcal:  1800-2000 kcal  Protein:  90-100 g/d  Fluid:  >1825mL/d   80m, RD, LDN Clinical Dietitian Pager on Amion

## 2020-10-24 NOTE — Progress Notes (Signed)
PROGRESS NOTE    Morgan Hicks  VEH:[id number] DOB: [birth year] DOA: 10/23/2020 PCP: Hillery Aldo, MD   Chief complaint: low back pain. Brief Narrative:  Morgan Hicks is a 55 y.o. female with medical history significant of back pain, HCV, GERD, depression, tobacco abuse, iron deficiency anemia, who presents with dizziness and back pain.  The emergency room, as her hemoglobin was up to 5.4, she was given 2 units of PRBC. EGD was performed on 5/6 showed esophagitis, gastritis, duodenum AVM treated with argon laser.   Assessment & Plan:   Principal Problem:   Symptomatic anemia Active Problems:   Iron deficiency anemia   Tobacco abuse   Sciatica   GERD (gastroesophageal reflux disease)   Dizziness   Acute gastric erosion   Gastric nodule   AVM (arteriovenous malformation) of small bowel, acquired  #1.  Severe iron deficient anemia. Status post EGD, no active bleeding at this time.  Patient has severe iron deficiency, received IV iron. B12 level borderline, will start B12 injection.  Check homocystine level. We will keep patient another night to make sure hemoglobin stable.  2.  Severe back pain with sciatica. Continue as needed tramadol.  DC IV pain medicine.  Continue Lidoderm patch.  Attending physical therapy.  3.  Hypokalemia. Repleted.    DVT prophylaxis: SCDs Code Status: Full Family Communication:  Disposition Plan:  .   Status is: Inpatient  Remains inpatient appropriate because:Inpatient level of care appropriate due to severity of illness   Dispo: The patient is from: Home              Anticipated d/c is to: Home              Patient currently is not medically stable to d/c.   Difficult to place patient No        I/O last 3 completed shifts: In: 1747.3 [P.O.:536; I.V.:151.9; Blood:952.9; IV Piggyback:106.5] Out: -  No intake/output data recorded.     Consultants:   GI  Procedures: EGD  Antimicrobials: None  Subjective: Patient still  complaining lower back pain with sciatica.  Worse with movement.  No incontinence of urine or stool. Denies any short of breath or cough. No dysuria hematuria. No fever chills. No headache or dizziness.  Objective: Vitals:   10/24/20 0924 10/24/20 0934 10/24/20 0944 10/24/20 1013  BP: 133/62 135/65 (!) 143/66 (!) 144/66  Pulse: 83 80 77 76  Resp: 17 16 18 16   Temp:    98.4 F (36.9 C)  TempSrc:      SpO2: 97% 98% 100% 97%  Weight:      Height:        Intake/Output Summary (Last 24 hours) at 10/24/2020 1428 Last data filed at 10/24/2020 0300 Gross per 24 hour  Intake 1094.34 ml  Output --  Net 1094.34 ml   Filed Weights   10/22/20 2354  Weight: 113.4 kg    Examination:  General exam: Appears calm and comfortable  Respiratory system: Clear to auscultation. Respiratory effort normal. Cardiovascular system: S1 & S2 heard, RRR. No JVD, murmurs, rubs, gallops or clicks. No pedal edema. Gastrointestinal system: Abdomen is nondistended, soft and nontender. No organomegaly or masses felt. Normal bowel sounds heard. Central nervous system: Alert and oriented. No focal neurological deficits. Extremities: Symmetric 5 x 5 power. Skin: No rashes, lesions or ulcers Psychiatry: Mood & affect appropriate.     Data Reviewed: I have personally reviewed following labs and imaging studies  CBC: Recent Labs  Lab 10/23/20 0459 10/23/20 2059 10/24/20 0015 10/24/20 0638 10/24/20 1218  WBC 6.1 5.3 6.2 6.4 6.1  NEUTROABS 4.1  --   --   --   --   HGB 5.4* 7.4* 7.2* 7.2* 7.4*  HCT 19.5* 24.7* 24.0* 24.1* 25.1*  MCV 78.0* 78.7* 78.9* 80.3 79.4*  PLT 330 297 293 301 295   Basic Metabolic Panel: Recent Labs  Lab 10/23/20 0459 10/24/20 0015  NA 142 140  K 3.9 3.3*  CL 113* 111  CO2 22 19*  GLUCOSE 116* 171*  BUN 11 8  CREATININE 0.75 0.77  CALCIUM 8.8* 8.6*   GFR: Estimated Creatinine Clearance: 105.5 mL/min (by C-G formula based on SCr of 0.77 mg/dL). Liver Function  Tests: Recent Labs  Lab 10/23/20 2059  AST 25  ALT 23  ALKPHOS 73  BILITOT 1.5*  PROT 7.5  ALBUMIN 3.5   No results for input(s): LIPASE, AMYLASE in the last 168 hours. Recent Labs  Lab 10/23/20 1222  AMMONIA 15   Coagulation Profile: Recent Labs  Lab 10/23/20 0555  INR 1.0   Cardiac Enzymes: No results for input(s): CKTOTAL, CKMB, CKMBINDEX, TROPONINI in the last 168 hours. BNP (last 3 results) No results for input(s): PROBNP in the last 8760 hours. HbA1C: No results for input(s): HGBA1C in the last 72 hours. CBG: Recent Labs  Lab 10/23/20 1202 10/23/20 2118 10/24/20 1019  GLUCAP 163* 162* 116*   Lipid Profile: No results for input(s): CHOL, HDL, LDLCALC, TRIG, CHOLHDL, LDLDIRECT in the last 72 hours. Thyroid Function Tests: No results for input(s): TSH, T4TOTAL, FREET4, T3FREE, THYROIDAB in the last 72 hours. Anemia Panel: Recent Labs    10/23/20 0459 10/23/20 0555 10/23/20 1222  VITAMINB12 265  --   --   FOLATE  --   --  11.1  FERRITIN  --   --  6*  TIBC  --   --  531*  IRON  --   --  31  RETICCTPCT  --  3.1  --    Sepsis Labs: No results for input(s): PROCALCITON, LATICACIDVEN in the last 168 hours.  Recent Results (from the past 240 hour(s))  Resp Panel by RT-PCR (Flu A&B, Covid) Nasopharyngeal Swab     Status: None   Collection Time: 10/23/20  5:55 AM   Specimen: Nasopharyngeal Swab; Nasopharyngeal(NP) swabs in vial transport medium  Result Value Ref Range Status   SARS Coronavirus 2 by RT PCR NEGATIVE NEGATIVE Final    Comment: (NOTE) SARS-CoV-2 target nucleic acids are NOT DETECTED.  The SARS-CoV-2 RNA is generally detectable in upper respiratory specimens during the acute phase of infection. The lowest concentration of SARS-CoV-2 viral copies this assay can detect is 138 copies/mL. A negative result does not preclude SARS-Cov-2 infection and should not be used as the sole basis for treatment or other patient management decisions. A  negative result may occur with  improper specimen collection/handling, submission of specimen other than nasopharyngeal swab, presence of viral mutation(s) within the areas targeted by this assay, and inadequate number of viral copies(<138 copies/mL). A negative result must be combined with clinical observations, patient history, and epidemiological information. The expected result is Negative.  Fact Sheet for Patients:  BloggerCourse.com  Fact Sheet for Healthcare Providers:  SeriousBroker.it  This test is no t yet approved or cleared by the [place] FDA and  has been authorized for detection and/or diagnosis of SARS-CoV-2 by FDA under an Emergency Use Authorization (EUA). This EUA will remain  in effect (  meaning this test can be used) for the duration of the COVID-19 declaration under Section 564(b)(1) of the Act, 21 U.S.C.section 360bbb-3(b)(1), unless the authorization is terminated  or revoked sooner.       Influenza A by PCR NEGATIVE NEGATIVE Final   Influenza B by PCR NEGATIVE NEGATIVE Final    Comment: (NOTE) The Xpert Xpress SARS-CoV-2/FLU/RSV plus assay is intended as an aid in the diagnosis of influenza from Nasopharyngeal swab specimens and should not be used as a sole basis for treatment. Nasal washings and aspirates are unacceptable for Xpert Xpress SARS-CoV-2/FLU/RSV testing.  Fact Sheet for Patients: BloggerCourse.com  Fact Sheet for Healthcare Providers: SeriousBroker.it  This test is not yet approved or cleared by the [place] FDA and has been authorized for detection and/or diagnosis of SARS-CoV-2 by FDA under an Emergency Use Authorization (EUA). This EUA will remain in effect (meaning this test can be used) for the duration of the COVID-19 declaration under Section 564(b)(1) of the Act, 21 U.S.C. section 360bbb-3(b)(1), unless the authorization  is terminated or revoked.  Performed at [facility], [address] [place], [place] [address]          Radiology Studies: No results found.      Scheduled Meds: . [START ON 10/25/2020] cyanocobalamin  1,000 mcg Intramuscular Once  . ferrous sulfate  325 mg Oral TID WC  . lidocaine  1 patch Transdermal Q24H  . nicotine  21 mg Transdermal Daily  . pantoprazole (PROTONIX) IV  40 mg Intravenous Q12H   Continuous Infusions: . potassium chloride 10 mEq (10/24/20 1338)     LOS: 1 day    Time spent: 28 minutes    Marrion Coy, MD [facility]   To contact the attending provider between 7A-7P or the covering provider during after hours 7P-7A, please log into the web site [email or web address] and access using universal  password for that web site. If you do not have the password, please call the hospital operator.  10/24/2020, 2:28 PM

## 2020-10-25 LAB — CBC WITH DIFFERENTIAL/PLATELET
Abs Immature Granulocytes: 0.04 10*3/uL (ref 0.00–0.07)
Basophils Absolute: 0 10*3/uL (ref 0.0–0.1)
Basophils Relative: 0 %
Eosinophils Absolute: 0 10*3/uL (ref 0.0–0.5)
Eosinophils Relative: 1 %
HCT: 24.5 % — ABNORMAL LOW (ref 36.0–46.0)
Hemoglobin: 7.2 g/dL — ABNORMAL LOW (ref 12.0–15.0)
Immature Granulocytes: 1 %
Lymphocytes Relative: 25 %
Lymphs Abs: 2 10*3/uL (ref 0.7–4.0)
MCH: 23.8 pg — ABNORMAL LOW (ref 26.0–34.0)
MCHC: 29.4 g/dL — ABNORMAL LOW (ref 30.0–36.0)
MCV: 80.9 fL (ref 80.0–100.0)
Monocytes Absolute: 0.5 10*3/uL (ref 0.1–1.0)
Monocytes Relative: 6 %
Neutro Abs: 5.4 10*3/uL (ref 1.7–7.7)
Neutrophils Relative %: 67 %
Platelets: 280 10*3/uL (ref 150–400)
RBC: 3.03 MIL/uL — ABNORMAL LOW (ref 3.87–5.11)
RDW: 19.7 % — ABNORMAL HIGH (ref 11.5–15.5)
WBC: 7.9 10*3/uL (ref 4.0–10.5)
nRBC: 1.9 % — ABNORMAL HIGH (ref 0.0–0.2)

## 2020-10-25 LAB — BASIC METABOLIC PANEL
Anion gap: 6 (ref 5–15)
BUN: 8 mg/dL (ref 6–20)
CO2: 24 mmol/L (ref 22–32)
Calcium: 8.2 mg/dL — ABNORMAL LOW (ref 8.9–10.3)
Chloride: 112 mmol/L — ABNORMAL HIGH (ref 98–111)
Creatinine, Ser: 0.83 mg/dL (ref 0.44–1.00)
GFR, Estimated: >60 mL/min (ref >60)
Glucose, Bld: 132 mg/dL — ABNORMAL HIGH (ref 70–99)
Potassium: 3.3 mmol/L — ABNORMAL LOW (ref 3.5–5.1)
Sodium: 142 mmol/L (ref 135–145)

## 2020-10-25 LAB — HOMOCYSTEINE: Homocysteine: 14.1 umol/L (ref 0.0–14.5)

## 2020-10-25 LAB — MAGNESIUM: Magnesium: 2.1 mg/dL (ref 1.7–2.4)

## 2020-10-25 MED ORDER — GABAPENTIN 100 MG PO CAPS
100.0000 mg | ORAL_CAPSULE | Freq: Two times a day (BID) | ORAL | 0 refills | Status: AC
  Filled 2020-10-25: qty 60, 30d supply, fill #0

## 2020-10-25 MED ORDER — POTASSIUM CHLORIDE 10 MEQ/100ML IV SOLN
10.0000 meq | INTRAVENOUS | Status: AC
  Administered 2020-10-25 (×4): 10 meq via INTRAVENOUS
  Filled 2020-10-25: qty 100

## 2020-10-25 MED ORDER — TRAMADOL HCL 50 MG PO TABS: 50.0000 mg | ORAL_TABLET | Freq: Four times a day (QID) | ORAL | 0 refills | Status: AC | PRN

## 2020-10-25 MED ORDER — PANTOPRAZOLE SODIUM 40 MG PO TBEC: 40.0000 mg | DELAYED_RELEASE_TABLET | Freq: Two times a day (BID) | ORAL | 0 refills | Status: DC

## 2020-10-25 MED ORDER — LIDOCAINE 5 % EX PTCH
1.0000 | MEDICATED_PATCH | CUTANEOUS | 0 refills | Status: DC
  Filled 2020-10-25: qty 30, 30d supply, fill #0

## 2020-10-25 MED ORDER — LIDOCAINE 5 % EX PTCH: 1.0000 | MEDICATED_PATCH | CUTANEOUS | 0 refills | Status: DC

## 2020-10-25 MED ORDER — GABAPENTIN 100 MG PO CAPS: 100.0000 mg | ORAL_CAPSULE | Freq: Two times a day (BID) | ORAL | 0 refills | Status: DC

## 2020-10-25 MED ORDER — VITAMIN B-12 1000 MCG PO TABS: 1000.0000 ug | ORAL_TABLET | Freq: Every day | ORAL | 0 refills | Status: DC

## 2020-10-25 MED ORDER — VITAMIN B-12 1000 MCG PO TABS
1000.0000 ug | ORAL_TABLET | Freq: Every day | ORAL | 0 refills | Status: DC
  Filled 2020-10-25: qty 30, 30d supply, fill #0

## 2020-10-25 MED ORDER — PANTOPRAZOLE SODIUM 40 MG PO TBEC
40.0000 mg | DELAYED_RELEASE_TABLET | Freq: Two times a day (BID) | ORAL | 0 refills | Status: DC
  Filled 2020-10-25: qty 60, 30d supply, fill #0

## 2020-10-25 MED ORDER — SODIUM CHLORIDE 0.9 % IV SOLN
300.0000 mg | Freq: Once | INTRAVENOUS | Status: AC
  Administered 2020-10-25: 300 mg via INTRAVENOUS
  Filled 2020-10-25: qty 15

## 2020-10-25 NOTE — Progress Notes (Addendum)
Patient still experiencing 8/10 pain radiating down RLEx. Educated patient on pain control and mobility.

## 2020-10-25 NOTE — Evaluation (Signed)
Physical Therapy Evaluation Patient Details Name: Morgan Hicks MRN: [id number] DOB: 1965/09/05 Today's Date: 10/25/2020   History of Present Illness  Morgan Hicks is a 55 y.o. female with medical history significant of back pain, HCV, GERD, depression, tobacco abuse, iron deficiency anemia, who presented to hospital on 10/23/2020 with dizziness and back pain. Diagnosed with chornic GI blood loss. Transfused with 3 units of blood. Upper endoscopy revealed A single non-bleeding angioectasia in the duodenum. Treated with argon plasma coagulation (APC) (see report for more details), and no signs of active GI bleed. Back pain continues to radiated down R LE.    Clinical Impression  Patient alert and oriented and able to provide detailed history. She lives alone in a handicapped equipped apartment with level entry. 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 Over the last month she has been getting around her house mostly with a rolling office chair and she has been avoiding goin out as much as possible. Her son and daughter in law can stay with her currently if needed. She has been seeing an MD for her low back pain but had not received any PT for it prior to her hospital stay. Upon PT eval, patient most limited by high level of low R leg pain that appears to be radiating from her back. Dermatome exam revealed deficits in light touch on R side at L5. Myotome assessment limited due to pain but did appear to have some weakness at R great toe extension and ankle PF. Patient has reproduction of leg symptoms with prone CPA to lowest lumbar segments of spine and palpation of R glute muscles. Patient appears to have flexion deformity often seen with low back pain and trial of gradual correction via prone lying with pillows under abdomen removed sequentially as tolerated until patient could perform prone on elbows  caused centralization of symptoms to R gluteal fold and improved her ability to perform functional mobility. Progression into lumbar extension beyond this point was limited by UE weakness (unable to perform prone press up), and radiation into both glutes with head of bed elevated, so progression was discontinued. Patient required increased time for bed mobility and CGA to supervision for sit <> stand and ambulation ~ 75 feet with RW. At first, patient could only tolerate standing for a few seconds and was unable to take steps. After prone progression, patient able to stand and ambulate with flexed posture, antalgic gait favoring R LE with decreased weight bearing on that side. Patient appears to have experienced a significant decline in functional mobility/independence and would benefit from additional outpatient outpatient PT to continue addressing her low back pain with radiculopathy if able to get transportation to outpatient clinic. If not able to leave home, she would benefit from HHPT. Patient would benefit from skilled physical therapy to address impairments and functional limitations (see PT Problem List below) to work towards stated goals and return to PLOF or maximal functional independence.       Follow Up Recommendations Home health PT;Outpatient PT (Patient would benefit from outpatient PT to address back pain and radiculopathy if able to get/tolerate transport. If unable to leave home, HHPT.)    Equipment Recommendations  Rolling walker with 5" wheels;3in1 (PT)    Recommendations for Other Services       Precautions / Restrictions Precautions Precautions: Fall Restrictions Weight Bearing Restrictions:  No      Mobility  Bed Mobility Overal bed mobility: Needs Assistance Bed Mobility: Supine to Sit;Rolling Rolling: Modified independent (Device/Increase time)   Supine to sit: Modified independent (Device/Increase time)     General bed mobility comments: able to go supine to  sit, sit <> prone, rolling with mod I due to pain.    Transfers Overall transfer level: Needs assistance Equipment used: Rolling walker (2 wheeled) Transfers: Sit to/from Stand Sit to Stand: From elevated surface;Supervision;Min guard         General transfer comment: Patient initially had great difficulty performing sit <> stand at ede of bed and could only stand a few seconds without being able to take steps. After prone lying and partial centralization of pain, patien able to perform sit <> stand at edge of bed with supervision.  Ambulation/Gait Ambulation/Gait assistance: Supervision;Min guard Gait Distance (Feet): 75 Feet Assistive device: Rolling walker (2 wheeled) Gait Pattern/deviations: Antalgic;Trunk flexed Gait velocity: very slow.   General Gait Details: Patient at first unable to bear weight on R LE due to pain and could not stand for more than a few secons with RW. After prone lying sequence to centralize pain, patient able to ambulate ~ 75 feet with RW supervision. Demo antalgic gait favoring R LE and avoids putting full weight on R LE due to pain. No evidence of R LE buckling. Patient walks and stands flexed at trunk/hips.  Stairs            Wheelchair Mobility    Modified Rankin (Stroke Patients Only)       Balance Overall balance assessment: Needs assistance Sitting-balance support: No upper extremity supported;Feet supported Sitting balance-Leahy Scale: Good Sitting balance - Comments: steady sitting at edge of bed   Standing balance support: During functional activity;No upper extremity supported Standing balance-Leahy Scale: Fair Standing balance comment: Patient dependent on B UE support on RW during ambulation but can balance while taking both hands off support for short periods.                             Pertinent Vitals/Pain Pain Assessment: 0-10 Pain Score: 8  Pain Location: right leg down to toes Pain Descriptors /  Indicators: Shooting Pain Intervention(s): Limited activity within patient's tolerance;Premedicated before session;Repositioned;Other (comment) (assessed for directional preference and educated pt)    Home Living Family/patient expects to be discharged to:: Private residence Living Arrangements: Alone Available Help at Discharge: Family;Available 24 hours/day (sone and DIL) Type of Home: Apartment Home Access: Level entry     Home Layout: One level (handicap equipped) Home Equipment: None      Prior Function Level of Independence: Independent         Comments: Prior to onset of back pain about 1 month ago, patient was fully I with all ADLs and mobility and 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 In the last month she has been off work and uses her rolling office chair to get around the home due to back pain. She does not go out if possible due to back pain.     Hand Dominance        Extremity/Trunk Assessment   Upper Extremity Assessment Upper Extremity Assessment: Generalized weakness (unable to perform prone press up due to UE weakness)    Lower Extremity Assessment Lower Extremity Assessment: RLE deficits/detail RLE Deficits / Details: sensation decreased to light touch at lateral foot (L5 dermatome) R  compared to left. Strength/ROM assessment limited by pain. R great toe extension and PF 4/4 (L 4+/5) and painful. RLE: Unable to fully assess due to pain RLE Sensation: decreased light touch    Cervical / Trunk Assessment Cervical / Trunk Assessment: Other exceptions Cervical / Trunk Exceptions: appears to have acute flexion deformity associated with low back pain  Communication   Communication: No difficulties  Cognition Arousal/Alertness: Awake/alert Behavior During Therapy: WFL for tasks assessed/performed Overall Cognitive Status: Within Functional Limits for tasks assessed                                        General Comments General  comments (skin integrity, edema, etc.): reports no falls in the last 6 months. Prone CPA toL 3-L5 in lumbar spine reproduces concordant R leg pain. TTP at right and left gluteal region with increased paresthesi in corresponding leg.    Exercises Other Exercises Other Exercises: Prone lying with 2 pillows under abdomen x 3 min, Prone lying with 1 pillow under abdomen x 1 min, Prone lying with no pillows x 2 min (pain centralized from foot to R gluteal fold), Prone on elbows (partial) x 2 min (continues to have pain centralized to gluteal fold). Attempted prone press up (unable due to arms too weak). Prone with head of bed elevated (pain produced to bilateral gluteal folds and increased so discontinued). Educated patient about low back pain with radiation due to nerve irritation, directional preference, centralization vs peripheralization and reccomendation to practice prone position progressing to extension gradually as tolerated. Attempted standing lumbar extension but unable to tolerate more than 3 reps. Educated on role of PT in acute care vs outpatien settion, discharge reccomendations.   Assessment/Plan    PT Assessment Patient needs continued PT services  PT Problem List Decreased strength;Decreased range of motion;Pain;Impaired sensation;Decreased activity tolerance;Decreased knowledge of use of DME;Decreased balance;Decreased mobility       PT Treatment Interventions DME instruction;Gait training;Functional mobility training;Therapeutic activities;Therapeutic exercise;Manual techniques;Patient/family education;Neuromuscular re-education;Balance training    PT Goals (Current goals can be found in the Care Plan section)  Acute Rehab PT Goals Patient Stated Goal: to go home and feel better PT Goal Formulation: With patient Time For Goal Achievement: 11/08/20 Potential to Achieve Goals: Good    Frequency Min 2X/week   Barriers to discharge   limited ambulation, pain    Co-evaluation                AM-PAC PT "6 Clicks" Mobility  Outcome Measure Help needed turning from your back to your side while in a flat bed without using bedrails?: None Help needed moving from lying on your back to sitting on the side of a flat bed without using bedrails?: None Help needed moving to and from a bed to a chair (including a wheelchair)?: A Little Help needed standing up from a chair using your arms (e.g., wheelchair or bedside chair)?: A Little Help needed to walk in hospital room?: A Little Help needed climbing 3-5 steps with a railing? : A Lot 6 Click Score: 19    End of Session Equipment Utilized During Treatment: Gait belt Activity Tolerance: Patient limited by pain Patient left: in bed;with call bell/phone within reach (in prone) Nurse Communication: Mobility status (directional preference) PT Visit Diagnosis: Difficulty in walking, not elsewhere classified (R26.2);Muscle weakness (generalized) (M62.81);Pain Pain - Right/Left: Right Pain - part of body: Leg (and  back)    Time: 2542-7062 PT Time Calculation (min) (ACUTE ONLY): 55 min   Charges:   PT Evaluation $PT Eval Moderate Complexity: 1 Mod PT Treatments $Therapeutic Exercise: 8-22 mins $Therapeutic Activity: 8-22 mins        Luretha Murphy. Ilsa Iha, PT, DPT 10/25/20, 11:29 AM

## 2020-10-25 NOTE — TOC Transition Note (Addendum)
Transition of Care [facility]) - CM/SW Discharge Note   Patient Details  Name: Morgan Hicks MRN: [id number] Date of Birth: Mar 14, 1966  [facility] CM/SW Contact:  Liliana Cline, LCSW Phone Number: 10/25/2020, 4:07 PM   Clinical Narrative:    Patient to discharge home today. PT recommended HHPT. CSW spoke to patient. She is declining home health. She is agreeable to DME recommendations for 3 in 1 and RW. Called [employer], they do not do charity DME. Called [facility], [name] reported she will have DME delivered to patient's hospital room, is aware patient is discharging today.   Updated patient's address in chart as she reported the address listed was incorrect.    4:30- Informed per RN that patient is not able to afford her meds which will be $160. RNCM unable to do match letter. Spoke to bedside RN. Patient to use good rx coupons (makes all meds less than $10 other than lidocaine which can wait until Monday per RN and MD) for meds to get enough until [facility] opens on Monday. Provided application for [facility] for patient to follow up Monday. Asked MD to update scripts for short term supply from [facility] until [facility] opens Monday. Per MD, all meds can wait until Monday, but all meds other than tramadol have been sent to both [facility] and [facility]. Patient can get meds with good rx from [facility] or follow up with [facility] on Monday.  Final next level of care: Home/Self Care Barriers to Discharge: Barriers Resolved   Patient Goals and CMS Choice Patient states their goals for this hospitalization and ongoing recovery are:: to return home CMS Medicare.gov Compare Post Acute Care list provided to:: Patient Choice offered to / list presented to : Patient  Discharge Placement                  Name of family member notified: patient notified. Patient and family notified of of transfer: 10/25/20  Discharge Plan and Services    Discharge Planning Services: CM Consult            DME Arranged: Dan Humphreys rolling,3-N-1 DME Agency: [facility] Date DME Agency Contacted: 10/25/20   Representative spoke with at DME Agency: [name] HH Arranged: NA HH Agency: NA        Social Determinants of Health (SDOH) Interventions     Readmission Risk Interventions No flowsheet data found.

## 2020-10-25 NOTE — Plan of Care (Signed)
Shift Summary: Pt orientedx4, VSS. Continues to endorse 10/10 pain in right hip with shooting down to foot, associated with tingling. PRN meds given per MAR. C/o dizziness addressed with x1 dose PRN meclizine. OBR started overnight d/t pt c/o abdominal pain/cramping. Pt endorses frustration with not having better pain control and not having answers to the cause of pain. Fall/safety precautions in place, rounding performed, needs/concerns addressed during shift.   Problem: Education: Goal: Knowledge of General Education information will improve Description: Including pain rating scale, medication(s)/side effects and non-pharmacologic comfort measures Outcome: Progressing   Problem: Health Behavior/Discharge Planning: Goal: Ability to manage health-related needs will improve Outcome: Progressing   Problem: Clinical Measurements: Goal: Ability to maintain clinical measurements within normal limits will improve Outcome: Progressing Goal: Will remain free from infection Outcome: Progressing Goal: Diagnostic test results will improve Outcome: Progressing Goal: Respiratory complications will improve Outcome: Progressing Goal: Cardiovascular complication will be avoided Outcome: Progressing   Problem: Activity: Goal: Risk for activity intolerance will decrease Outcome: Progressing   Problem: Nutrition: Goal: Adequate nutrition will be maintained Outcome: Progressing   Problem: Coping: Goal: Level of anxiety will decrease Outcome: Progressing   Problem: Elimination: Goal: Will not experience complications related to bowel motility Outcome: Progressing Goal: Will not experience complications related to urinary retention Outcome: Progressing   Problem: Pain Managment: Goal: General experience of comfort will improve Outcome: Progressing   Problem: Safety: Goal: Ability to remain free from injury will improve Outcome: Progressing   Problem: Skin Integrity: Goal: Risk for  impaired skin integrity will decrease Outcome: Progressing

## 2020-10-25 NOTE — Discharge Summary (Addendum)
Physician Discharge Summary  Patient ID: Morgan Hicks MRN: [id number] DOB/AGE: 56-May-1967 56 y.o.  Admit date: 10/23/2020 Discharge date: 10/25/2020  Admission Diagnoses:  Discharge Diagnoses:  Principal Problem:   Symptomatic anemia Active Problems:   Iron deficiency anemia   Tobacco abuse   Sciatica   GERD (gastroesophageal reflux disease)   Dizziness   Acute gastric erosion   Gastric nodule   AVM (arteriovenous malformation) of small bowel, acquired   Hypokalemia   Discharged Condition: good  Hospital Course:  Morgan Hicks a 55 y.o.femalewith medical history significant ofback pain, HCV,GERD, depression, tobacco abuse, iron deficiency anemia, who presents with dizziness and back pain.  The emergency room, as her hemoglobin was up to 5.4, she was given 2 units of PRBC. EGD was performed on 5/6 showed esophagitis, erosive gastropathy, gastric nodular mucosa, duodenum AVM treated with argon laser. Patient also received iron IV as well as injection of B12.  #1.  Severe iron deficient anemia. Status post EGD, no active bleeding at this time.  Patient has severe iron deficiency, received IV iron x2. B12 level borderline, received 2 days of B12 injection.  Pending homocystine level. Hemoglobin still stable today, will continue oral B12 supplement, will be followed with GI as well as hematology as outpatient.  2.  Severe back pain with sciatica. Continue as needed tramadol.    Lumbar spine x-ray showed degenerative changes without spinal stenosis or acute changes. Patient has chronic back pain for many years, sciatica for the last 2 weeks.  Patient need to follow-up with PCP as outpatient, may refer for steroid injection.   3.  Hypokalemia. Repleted.  Addendum: Patient could not afford her prescriptions. I have sent all prescriptions except tramadol to [facility].  I also called [facility] to provide 2 days dose for Neurontin, Protonix, Lidoderm and B12,  that will allow patient to have enough medicines to last until Monday, at that time, she can pick up the remaining medicines at [facility].   Consults: GI  Significant Diagnostic Studies:  LUMBAR SPINE - 2-3 VIEW  COMPARISON:  01/25/2018  FINDINGS: Lumbar alignment within normal limits. Vertebral body heights are grossly maintained. Mild disc space narrowing and degenerative change at L4-L5. Facet degenerative changes of the lower lumbar spine. Aortic atherosclerosis.  IMPRESSION: Degenerative changes of the lower lumbar spine. No acute osseous abnormality   Electronically Signed   By: Jasmine Pang M.D.   On: 10/24/2020 16:48   - LA Grade B reflux esophagitis with no bleeding. - Erosive gastropathy with no bleeding and no stigmata of recent bleeding. Biopsied. - Nodular mucosa in the gastric fundus and in the gastric body. Biopsied. - Medium-sized hiatal hernia. - A single non-bleeding angioectasia in the duodenum. Treated with argon plasma coagulation (APC). - Congested duodenal mucosa. Impression: - IV Iron replacement as an inpatient and follow up with hematology as an outpatient of IV iron replacement as needed -Follow up in GI clinic in 4-6 weeks to discuss colonoscopy. Without any signs of active GI bleeding and findings as above on upper endoscopy today, no indication for urgent colonoscopy as an inpatient unless anemia worsens or active bleeding occurs - Avoid NSAIDs except Aspirin if medically indicated by PCP - Use Protonix (pantoprazole) 40 mg PO BID for 4 weeks. - The findings and recommendations were discussed with the patient. - Advance diet as tolerated. Recommendation: ---   Treatments: Protonix, EGD PRBC, IV iron, b12  Discharge Exam: Blood pressure (!) 137/57, pulse 74, temperature 98.1  F (36.7 C), temperature source Oral, resp. rate 15, height 5\' 9"  (1.753 m), weight 98.7 kg, SpO2 97 %. General appearance: alert and  cooperative Resp: clear to auscultation bilaterally Cardio: regular rate and rhythm, S1, S2 normal, no murmur, click, rub or gallop GI: soft, non-tender; bowel sounds normal; no masses,  no organomegaly Extremities: extremities normal, atraumatic, no cyanosis or edema  Disposition: Discharge disposition: 01-Home or Self Care       Discharge Instructions    Diet - low sodium heart healthy   Complete by: As directed    Increase activity slowly   Complete by: As directed      Allergies as of 10/25/2020      Reactions   Codeine Itching   Percocet [oxycodone-acetaminophen]       Medication List    STOP taking these medications   ARTHRITIS STRENGTH BC POWDER PO   cyclobenzaprine 10 MG tablet Commonly known as: FLEXERIL   naproxen 500 MG tablet Commonly known as: NAPROSYN     TAKE these medications   ferrous sulfate 325 (65 FE) MG tablet Take 325 mg by mouth 3 (three) times daily with meals.   gabapentin 100 MG capsule Commonly known as: Neurontin Take 1 capsule (100 mg total) by mouth 2 (two) times daily.   lidocaine 5 % Commonly known as: LIDODERM Place 1 patch onto the skin daily. Remove & Discard patch within 12 hours or as directed by MD   MAALOX ADVANCED PO Take 1 tablet by mouth 3 times/day as needed-between meals & bedtime (gi distress).   pantoprazole 40 MG tablet Commonly known as: Protonix Take 1 tablet (40 mg total) by mouth 2 (two) times daily.   traMADol 50 MG tablet Commonly known as: ULTRAM Take 1 tablet (50 mg total) by mouth every 6 (six) hours as needed for severe pain.   vitamin B-12 1000 MCG tablet Commonly known as: CYANOCOBALAMIN Take 1 tablet (1,000 mcg total) by mouth daily.       Follow-up Information    12/25/2020, MD Follow up in 1 week(s).   Specialty: Family Medicine Contact information: [address] [place] [phone]        [phone], MD Follow up in 2 week(s).    Specialties: Internal Medicine, Oncology Contact information: [address] [place] [phone]        [phone], MD Follow up in 4 week(s).   Specialty: Gastroenterology Contact information: [address] [place] [phone]              36 minutes Signed: [phone] 10/25/2020, 9:29 AM

## 2020-10-27 ENCOUNTER — Encounter: Payer: Self-pay | Admitting: Gastroenterology

## 2020-10-27 ENCOUNTER — Other Ambulatory Visit: Payer: Self-pay

## 2020-10-27 NOTE — Anesthesia Postprocedure Evaluation (Signed)
Anesthesia Post Note  Patient: Morgan Hicks  Procedure(s) Performed: ESOPHAGOGASTRODUODENOSCOPY (EGD) (N/A )  Patient location during evaluation: PACU Anesthesia Type: General Level of consciousness: awake and alert Pain management: pain level controlled Vital Signs Assessment: post-procedure vital signs reviewed and stable Respiratory status: spontaneous breathing, nonlabored ventilation, respiratory function stable and patient connected to nasal cannula oxygen Cardiovascular status: blood pressure returned to baseline and stable Postop Assessment: no apparent nausea or vomiting Anesthetic complications: no   No complications documented.   Last Vitals:  Vitals:   10/25/20 1203 10/25/20 1645  BP: (!) 143/72 128/77  Pulse: 71 69  Resp: 15 15  Temp: 36.7 C 36.9 C  SpO2: 99% 99%    Last Pain:  Vitals:   10/25/20 1658  TempSrc:   PainSc: 0-No pain                 Yevette Edwards

## 2020-10-27 NOTE — Anesthesia Postprocedure Evaluation (Signed)
Anesthesia Post Note  Patient: Morgan Hicks  Procedure(s) Performed: ESOPHAGOGASTRODUODENOSCOPY (EGD) (N/A )  Patient location during evaluation: PACU Anesthesia Type: General Level of consciousness: awake and alert Pain management: pain level controlled Vital Signs Assessment: post-procedure vital signs reviewed and stable Respiratory status: spontaneous breathing, nonlabored ventilation, respiratory function stable and patient connected to nasal cannula oxygen Cardiovascular status: blood pressure returned to baseline and stable Postop Assessment: no apparent nausea or vomiting Anesthetic complications: no   No complications documented.   Last Vitals:  Vitals:   10/25/20 1203 10/25/20 1645  BP: (!) 143/72 128/77  Pulse: 71 69  Resp: 15 15  Temp: 36.7 C 36.9 C  SpO2: 99% 99%    Last Pain:  Vitals:   10/25/20 1658  TempSrc:   PainSc: 0-No pain                 Evva Din G Zaylynn Rickett     

## 2020-10-28 ENCOUNTER — Encounter: Payer: Self-pay | Admitting: Gastroenterology

## 2020-10-28 LAB — SURGICAL PATHOLOGY

## 2020-12-15 ENCOUNTER — Encounter: Payer: Self-pay | Admitting: Gastroenterology

## 2020-12-15 ENCOUNTER — Other Ambulatory Visit: Payer: Self-pay | Admitting: Family Medicine

## 2020-12-15 ENCOUNTER — Ambulatory Visit
Admission: RE | Admit: 2020-12-15 | Discharge: 2020-12-15 | Disposition: A | Discharge status: Home / Self Care | Payer: Self-pay | Source: Ambulatory Visit | Attending: Family Medicine | Admitting: Family Medicine

## 2020-12-15 ENCOUNTER — Ambulatory Visit (INDEPENDENT_AMBULATORY_CARE_PROVIDER_SITE_OTHER): Payer: Self-pay | Admitting: Gastroenterology

## 2020-12-15 ENCOUNTER — Other Ambulatory Visit: Payer: Self-pay

## 2020-12-15 VITALS — BP 132/77 | HR 82 | Temp 97.7°F

## 2020-12-15 DIAGNOSIS — M25562 Pain in left knee: Secondary | ICD-10-CM | POA: Insufficient documentation

## 2020-12-15 DIAGNOSIS — D509 Iron deficiency anemia, unspecified: Secondary | ICD-10-CM

## 2020-12-15 DIAGNOSIS — B1920 Unspecified viral hepatitis C without hepatic coma: Secondary | ICD-10-CM

## 2020-12-15 MED ORDER — CLENPIQ 10-3.5-12 MG-GM -GM/160ML PO SOLN: 1.0000 | Freq: Once | ORAL | 0 refills | Status: AC

## 2020-12-15 NOTE — Progress Notes (Signed)
Vonda Antigua, MD [address]  [place]  [place], [place] [address]  Main: [phone]  Fax: [phone]   Primary Care Physician: Denton Lank, MD   Chief complaint: Hospital follow-up, anemia  HPI: Morgan Hicks is a 55 y.o. female here for posthospitalization follow-up for iron deficiency anemia.  Patient denies any abdominal pain, nausea or vomiting, weight loss.  No blood in stool.  Has not seen a hematologist since discharge.   ROS: All ROS reviewed and negative except as per HPI   Past Medical History:  Diagnosis Date   Atopic dermatitis    Constipation    Depression    GERD (gastroesophageal reflux disease)    Hepatitis C    Sciatica    Tobacco use     Past Surgical History:  Procedure Laterality Date   ESOPHAGOGASTRODUODENOSCOPY N/A 10/24/2020   Procedure: ESOPHAGOGASTRODUODENOSCOPY (EGD);  Surgeon: Virgel Manifold, MD;  Location: [facility] ENDOSCOPY;  Service: Endoscopy;  Laterality: N/A;   paraguard IUD surgery  05/22/2002    Prior to Admission medications   Medication Sig Start Date End Date Taking? Authorizing Provider  Alum & Mag Hydroxide-Simeth (MAALOX ADVANCED PO) Take 1 tablet by mouth 3 times/day as needed-between meals & bedtime (gi distress).    Yes [provider]  ferrous sulfate 325 (65 FE) MG tablet Take 325 mg by mouth 3 (three) times daily with meals.   Yes [provider]  lidocaine (LIDODERM) 5 % Place 1 patch onto the skin once daily. Remove and discard patch within 12 hours or as directed by MD. 10/25/20  Yes Sharen Hones, MD  Sod Picosulfate-Mag Ox-Cit Acd (CLENPIQ) 10-3.5-12 MG-GM -GM/160ML SOLN Take 1 kit by mouth once for 1 dose. 12/15/20 12/15/20 Yes Dustina Scoggin, Lennette Bihari, MD  traMADol (ULTRAM) 50 MG tablet Take 1 tablet (50 mg total) by mouth every 6 (six) hours as needed for severe pain. 10/25/20  Yes Sharen Hones, MD  vitamin B-12 (CYANOCOBALAMIN) 1000 MCG tablet Take 1 tablet (1,000 mcg total) by mouth daily.  10/25/20  Yes Sharen Hones, MD  gabapentin (NEURONTIN) 100 MG capsule Take 1 capsule (100 mg total) by mouth 2 (two) times daily. 10/25/20 11/27/20  Sharen Hones, MD  pantoprazole (PROTONIX) 40 MG tablet Take 1 tablet (40 mg total) by mouth 2 (two) times daily. 10/25/20 11/27/20  Sharen Hones, MD    Family History  Problem Relation Age of Onset   Stomach cancer Mother 59   Cervical cancer Mother    Cirrhosis Father    Breast cancer Sister 64   Throat cancer Other      Social History   Tobacco Use   Smoking status: Every Day    Years: 10.00    Pack years: 0.00    Types: Cigarettes   Smokeless tobacco: Never   Tobacco comments:    5 cigerattes per day  Vaping Use   Vaping Use: Never used  Substance Use Topics   Alcohol use: No   Drug use: Never    Allergies as of 12/15/2020 - Review Complete 12/15/2020  Allergen Reaction Noted   Codeine Itching 10/05/2016   Percocet [oxycodone-acetaminophen]  02/24/2018    Physical Examination:  Constitutional: General:   Alert,  Well-developed, well-nourished, pleasant and cooperative in NAD BP 132/77   Pulse 82   Temp 97.7 F (36.5 C) (Oral)   Respiratory: Normal respiratory effort  Gastrointestinal:  Normal bowel sounds.  No bruits.  Soft, non-tender and non-distended without masses, hepatosplenomegaly or hernias  noted.  No guarding or rebound tenderness.     Cardiac: No clubbing or edema.  No cyanosis. Normal posterior tibial pedal pulses noted.  Psych:  Alert and cooperative. Normal mood and affect.  Musculoskeletal:  Normal gait. Head normocephalic, atraumatic. Symmetrical without gross deformities. 5/5 Lower extremity strength bilaterally.  Skin: Warm. Intact without significant lesions or rashes. No jaundice.  Neck: Supple, trachea midline  Lymph: No cervical lymphadenopathy  Psych:  Alert and oriented x3, Alert and cooperative. Normal mood and affect.  Labs: CMP     Component Value Date/Time   NA 142 10/25/2020 0511    NA 141 02/28/2014 1204   K 3.3 (L) 10/25/2020 0511   K 3.7 02/28/2014 1204   CL 112 (H) 10/25/2020 0511   CL 111 (H) 02/28/2014 1204   CO2 24 10/25/2020 0511   CO2 21 02/28/2014 1204   GLUCOSE 132 (H) 10/25/2020 0511   GLUCOSE 148 (H) 02/28/2014 1204   BUN 8 10/25/2020 0511   BUN 13 02/28/2014 1204   CREATININE 0.83 10/25/2020 0511   CREATININE 1.19 02/28/2014 1204   CALCIUM 8.2 (L) 10/25/2020 0511   CALCIUM 9.0 02/28/2014 1204   PROT 7.5 10/23/2020 2059   PROT 7.9 02/28/2014 1204   ALBUMIN 3.5 10/23/2020 2059   ALBUMIN 2.9 (L) 02/28/2014 1204   AST 25 10/23/2020 2059   AST 73 (H) 02/28/2014 1204   ALT 23 10/23/2020 2059   ALT 87 (H) 02/28/2014 1204   ALKPHOS 73 10/23/2020 2059   ALKPHOS 94 02/28/2014 1204   BILITOT 1.5 (H) 10/23/2020 2059   BILITOT 0.5 02/28/2014 1204   GFRNONAA >60 10/25/2020 0511   GFRNONAA 54 (L) 02/28/2014 1204   GFRAA >60 06/12/2018 1432   GFRAA >60 02/28/2014 1204   Lab Results  Component Value Date   WBC 7.9 10/25/2020   HGB 7.2 (L) 10/25/2020   HCT 24.5 (L) 10/25/2020   MCV 80.9 10/25/2020   PLT 280 10/25/2020    Imaging Studies:   Assessment and Plan:   Morgan Hicks is a 55 y.o. y/o female here for posthospitalization follow-up for iron deficiency anemia  Patient will need further evaluation of her iron deficiency anemia with colonoscopy and this will be scheduled at this time  Patient will also need referral to hematology for iron deficiency anemia given that she has not seen them in about 2 years  She has history of hepatitis C and has never been treated, we will need to obtain further labs to evaluate this.  Obtain hep C antibody, RNA, and genotype  I will also need to get labs, CBC, to evaluate the status of her anemia to ensure it is improving    Dr Vonda Antigua

## 2020-12-15 NOTE — Patient Instructions (Addendum)
Referral to Hematology has been placed, they will call you to schedule appointment   Colonoscopy Procedure Prep  CLENPIQ DOSE  NIGHT BEFORE 5:00   1 Clenpiq Bottle and 5 Glasses of Water  Drink 1 bottle of CLENPIQ and five 8 ounce (oz) cups (40 oz) or more of clear liquids. Finish liquids over the next 5 hours.  MORNING OF  5 hours before the procedure  1 Clenpiq Bottle and 4 Glasses of Water  Drink the other bottle of CLENPIQ and four 8 ounce (oz) cups (32 oz) or more of clear liquids. Finish liquids 2 hours before your colonoscopy or as advised by your doctor.

## 2020-12-18 LAB — CBC
Hematocrit: 35.6 % (ref 34.0–46.6)
Hemoglobin: 11.7 g/dL (ref 11.1–15.9)
MCH: 29.7 pg (ref 26.6–33.0)
MCHC: 32.9 g/dL (ref 31.5–35.7)
MCV: 90 fL (ref 79–97)
Platelets: 347 10*3/uL (ref 150–450)
RBC: 3.94 x10E6/uL (ref 3.77–5.28)
RDW: 19.7 % — ABNORMAL HIGH (ref 11.7–15.4)
WBC: 4.1 10*3/uL (ref 3.4–10.8)

## 2020-12-18 LAB — HCV RNA QUANT

## 2020-12-18 LAB — HEPATITIS C GENOTYPE

## 2020-12-18 LAB — HEPATITIS C ANTIBODY: Hep C Virus Ab: >11 s/co ratio — ABNORMAL HIGH (ref 0.0–0.9)

## 2020-12-18 LAB — HCV RNA (INTERNATIONAL UNITS)
HCV RNA (International Units): 10400000 IU/mL
HCV log10: 7.017 log10 IU/mL

## 2020-12-19 ENCOUNTER — Telehealth: Payer: Self-pay

## 2020-12-19 NOTE — Telephone Encounter (Signed)
Additional testing for fibrosis needed. Contacted [facility] to add Fibrosure.

## 2020-12-19 NOTE — Telephone Encounter (Signed)
-----   Message from Pasty Spillers, MD sent at 12/19/2020  9:24 AM EDT ----- Hi Christine Morton and melanie, can you let this pt know her Hep C labs do show high RNA levels which means she needs treatment. Can we start the approval process for this. Please let her know her anemia has improved.

## 2020-12-25 LAB — HCV FIBROSURE
ALPHA 2-MACROGLOBULINS, QN: 178 mg/dL (ref 110–276)
ALT (SGPT) P5P: 29 IU/L (ref 0–40)
Apolipoprotein A-1: 190 mg/dL (ref 116–209)
Bilirubin, Total: <0.1 mg/dL (ref 0.0–1.2)
Fibrosis Score: 0.04 (ref 0.00–0.21)
GGT: 63 IU/L — ABNORMAL HIGH (ref 0–60)
Haptoglobin: 200 mg/dL (ref 33–346)
Necroinflammat Activity Score: 0.1 (ref 0.00–0.17)

## 2020-12-25 LAB — SPECIMEN STATUS REPORT

## 2020-12-29 ENCOUNTER — Encounter: Payer: Self-pay | Admitting: *Deleted

## 2020-12-30 ENCOUNTER — Encounter: Payer: Self-pay | Admitting: Internal Medicine

## 2020-12-30 ENCOUNTER — Inpatient Hospital Stay: Payer: Self-pay

## 2021-01-06 ENCOUNTER — Ambulatory Visit: Admission: RE | Admit: 2021-01-06 | Payer: Self-pay | Source: Home / Self Care | Admitting: Gastroenterology

## 2021-01-06 ENCOUNTER — Encounter: Admission: RE | Payer: Self-pay | Source: Home / Self Care

## 2021-01-06 SURGERY — COLONOSCOPY WITH PROPOFOL
Anesthesia: General

## 2021-01-06 MED ORDER — LIDOCAINE HCL (PF) 2 % IJ SOLN
INTRAMUSCULAR | Status: AC
  Filled 2021-01-06: qty 5

## 2021-01-06 MED ORDER — PROPOFOL 500 MG/50ML IV EMUL
INTRAVENOUS | Status: AC
  Filled 2021-01-06: qty 50

## 2021-01-07 ENCOUNTER — Telehealth: Payer: Self-pay | Admitting: Pharmacist

## 2021-01-07 NOTE — Telephone Encounter (Signed)
Patient failed to provide requested 2022 financial documentation. No additional medication assistance will be provided by [facility] without the required proof of income documentation. Patient notified by letter.  Vonda Henderson [facility] Administrative Assistant 

## 2021-01-09 ENCOUNTER — Other Ambulatory Visit: Payer: Self-pay

## 2021-01-13 ENCOUNTER — Other Ambulatory Visit: Payer: Self-pay

## 2021-01-13 ENCOUNTER — Encounter: Payer: Self-pay | Admitting: Internal Medicine

## 2021-01-13 NOTE — Progress Notes (Deleted)
[facility] CONSULT NOTE  Patient Care Team: Hillery Aldo, MD as PCP - General (Family Medicine) Jim Like, RN as Registered Nurse Scarlett Presto, RN as Registered Nurse  CHIEF COMPLAINTS/PURPOSE OF CONSULTATION: Anemia  # AUG 2019-anemia 7-8;  MCV 80; electrophoresis-normal/PCP; ferritin 15; creat 0.88; LFTs-N; LDH- N;   #Hepatitis C positive [HCV >11]/ B-12/ folate; active smoker  #Active smoker. Oncology History   No history exists.     HISTORY OF PRESENTING ILLNESS:  Morgan Hicks 55 y.o.  female with no significant past medical history except for untreated hep C/active smoker is here for follow-up.  Patient did not have imaging done because of insurance reasons.  She finally had CT abdomen pelvis done last week.  Did not have chest x-ray.  Patient continues to complain of worsening fatigue.  Denies any blood in stools or black or stools.  Complains of poor appetite.  Complains of shortness of breath on exertion.  Review of Systems  Constitutional:  Positive for malaise/fatigue and weight loss. Negative for chills, diaphoresis and fever.  HENT:  Negative for nosebleeds and sore throat.   Eyes:  Negative for double vision.  Respiratory:  Positive for shortness of breath. Negative for cough, hemoptysis, sputum production and wheezing.   Cardiovascular:  Negative for chest pain, palpitations, orthopnea and leg swelling.  Gastrointestinal:  Negative for abdominal pain, blood in stool, constipation, diarrhea, heartburn, melena, nausea and vomiting.  Genitourinary:  Negative for dysuria, frequency and urgency.  Musculoskeletal:  Positive for back pain and myalgias. Negative for joint pain.  Skin: Negative.  Negative for itching and rash.  Neurological:  Negative for dizziness, tingling, focal weakness, weakness and headaches.  Endo/Heme/Allergies:  Does not bruise/bleed easily.  Psychiatric/Behavioral:  Negative for depression. The patient is not  nervous/anxious and does not have insomnia.     MEDICAL HISTORY:  Past Medical History:  Diagnosis Date   Atopic dermatitis    Constipation    Depression    GERD (gastroesophageal reflux disease)    Hepatitis C    Microcytic anemia    Sciatica    Tobacco use     SURGICAL HISTORY: Past Surgical History:  Procedure Laterality Date   ESOPHAGOGASTRODUODENOSCOPY N/A 10/24/2020   Procedure: ESOPHAGOGASTRODUODENOSCOPY (EGD);  Surgeon: Pasty Spillers, MD;  Location[facility] ENDOSCOPY;  Service: Endoscopy;  Laterality: N/A;   paraguard IUD surgery  05/22/2002    SOCIAL HISTORY: Social History   Socioeconomic History   Marital status: Single    Spouse name: Not on file   Number of children: Not on file   Years of education: Not on file   Highest education level: Not on file  Occupational History   Not on file  Tobacco Use   Smoking status: Every Day    Years: 10.00    Types: Cigarettes   Smokeless tobacco: Never   Tobacco comments:    5 cigerattes per day  Vaping Use   Vaping Use: Never used  Substance and Sexual Activity   Alcohol use: No   Drug use: Never   Sexual activity: Not on file  Other Topics Concern   Not on file  Social History Narrative   In health care; alochol/beer every other day; smoke 5 cig/day; in [place]   Social Determinants of Health   Financial Resource Strain: Not on file  Food Insecurity: Not on file  Transportation Needs: Not on file  Physical Activity: Not on file  Stress: Not on file  Social  Connections: Not on file  Intimate Partner Violence: Not on file    FAMILY HISTORY: Family History  Problem Relation Age of Onset   Stomach cancer Mother 33   Cervical cancer Mother    Cirrhosis Father    Breast cancer Sister 66   Throat cancer Other     ALLERGIES:  is allergic to codeine and percocet [oxycodone-acetaminophen].  MEDICATIONS:  Current Outpatient Medications  Medication Sig Dispense Refill   Alum & Mag  Hydroxide-Simeth (MAALOX ADVANCED PO) Take 1 tablet by mouth 3 times/day as needed-between meals & bedtime (gi distress).      ferrous sulfate 325 (65 FE) MG tablet Take 325 mg by mouth 3 (three) times daily with meals.     gabapentin (NEURONTIN) 100 MG capsule Take 1 capsule (100 mg total) by mouth 2 (two) times daily. 60 capsule 0   lidocaine (LIDODERM) 5 % Place 1 patch onto the skin once daily. Remove and discard patch within 12 hours or as directed by MD. 30 patch 0   pantoprazole (PROTONIX) 40 MG tablet Take 1 tablet (40 mg total) by mouth 2 (two) times daily. 60 tablet 0   traMADol (ULTRAM) 50 MG tablet Take 1 tablet (50 mg total) by mouth every 6 (six) hours as needed for severe pain. 12 tablet 0   vitamin B-12 (CYANOCOBALAMIN) 1000 MCG tablet Take 1 tablet (1,000 mcg total) by mouth daily. 30 tablet 0   No current facility-administered medications for this visit.      Marland Kitchen  PHYSICAL EXAMINATION: ECOG PERFORMANCE STATUS: 1 - Symptomatic but completely ambulatory  There were no vitals filed for this visit. There were no vitals filed for this visit.  Physical Exam HENT:     Head: Normocephalic and atraumatic.     Mouth/Throat:     Pharynx: No oropharyngeal exudate.  Eyes:     Pupils: Pupils are equal, round, and reactive to light.  Cardiovascular:     Rate and Rhythm: Normal rate and regular rhythm.  Pulmonary:     Effort: No respiratory distress.     Breath sounds: No wheezing.  Abdominal:     General: Bowel sounds are normal. There is no distension.     Palpations: Abdomen is soft. There is no mass.     Tenderness: There is no abdominal tenderness. There is no guarding or rebound.  Musculoskeletal:        General: No tenderness. Normal range of motion.     Cervical back: Normal range of motion and neck supple.  Skin:    General: Skin is warm.  Neurological:     Mental Status: She is alert and oriented to person, place, and time.  Psychiatric:        Mood and Affect:  Affect normal.     LABORATORY DATA:  I have reviewed the data as listed Lab Results  Component Value Date   WBC 4.1 12/15/2020   HGB 11.7 12/15/2020   HCT 35.6 12/15/2020   MCV 90 12/15/2020   PLT 347 12/15/2020   Recent Labs    10/23/20 0459 10/23/20 2059 10/24/20 0015 10/25/20 0511  NA 142  --  140 142  K 3.9  --  3.3* 3.3*  CL 113*  --  111 112*  CO2 22  --  19* 24  GLUCOSE 116*  --  171* 132*  BUN 11  --  8 8  CREATININE 0.75  --  0.77 0.83  CALCIUM 8.8*  --  8.6* 8.2*  GFRNONAA >60  --  >60 >60  PROT  --  7.5  --   --   ALBUMIN  --  3.5  --   --   AST  --  25  --   --   ALT  --  23  --   --   ALKPHOS  --  73  --   --   BILITOT  --  1.5*  --   --   BILIDIR  --  0.2  --   --   IBILI  --  1.3*  --   --     RADIOGRAPHIC STUDIES: I have personally reviewed the radiological images as listed and agreed with the findings in the report. DG Knee Complete 4 Views Left  Result Date: 12/15/2020 CLINICAL DATA:  56 year old female with left knee pain. EXAM: LEFT KNEE - COMPLETE 4+ VIEW COMPARISON:  None. FINDINGS: There is no acute fracture or dislocation. Several faint punctate radiopaque foci noted over the tibial spine on the AP view. The bones are well mineralized. No arthritic changes. No joint effusion. The soft tissues are unremarkable. IMPRESSION: Negative. Electronically Signed   By: Elgie Collard M.D.   On: 12/15/2020 15:27    ASSESSMENT & PLAN:   No problem-specific Assessment & Plan notes found for this encounter.   All questions were answered. The patient knows to call the clinic with any problems, questions or concerns.    Earna Coder, MD 01/13/2021 2:15 PM

## 2021-03-18 ENCOUNTER — Ambulatory Visit (INDEPENDENT_AMBULATORY_CARE_PROVIDER_SITE_OTHER): Payer: Self-pay | Admitting: Gastroenterology

## 2021-03-18 ENCOUNTER — Encounter: Payer: Self-pay | Admitting: Gastroenterology

## 2021-03-18 ENCOUNTER — Other Ambulatory Visit: Payer: Self-pay

## 2021-03-18 VITALS — BP 147/76 | HR 85 | Temp 98.3°F | Wt 226.0 lb

## 2021-03-18 DIAGNOSIS — B182 Chronic viral hepatitis C: Secondary | ICD-10-CM

## 2021-03-18 DIAGNOSIS — D509 Iron deficiency anemia, unspecified: Secondary | ICD-10-CM

## 2021-03-18 MED ORDER — MAVYRET 100-40 MG PO TABS: 3.0000 | ORAL_TABLET | Freq: Every day | ORAL | 1 refills | Status: DC

## 2021-03-18 NOTE — Progress Notes (Signed)
Morgan Bouillon, MD [address]  [address]  [place], [place] [address]  Main: [phone]  Fax: [phone]   Primary Care Physician: Hillery Aldo, MD   Chief complaint: Anemia  HPI: Morgan Hicks is a 55 y.o. female here for follow-up of iron deficiency anemia.  Patient was supposed to have a colonoscopy after her last clinic visit.  However, patient states she had a death in the family and therefore had to cancel her procedure.  Denies any abdominal pain, nausea or vomiting.  No dysphagia.  Has undergone EGD during hospital stay, please see procedure results.  Patient was referred to hematology but was a no-show for her appointment.  Has history of untreated hep C as well   ROS: All ROS reviewed and negative except as per HPI   Past Medical History:  Diagnosis Date   Atopic dermatitis    Constipation    Depression    GERD (gastroesophageal reflux disease)    Hepatitis C    Microcytic anemia    Sciatica    Tobacco use     Past Surgical History:  Procedure Laterality Date   ESOPHAGOGASTRODUODENOSCOPY N/A 10/24/2020   Procedure: ESOPHAGOGASTRODUODENOSCOPY (EGD);  Surgeon: Pasty Spillers, MD;  Location: [facility] ENDOSCOPY;  Service: Endoscopy;  Laterality: N/A;   paraguard IUD surgery  05/22/2002    Prior to Admission medications   Medication Sig Start Date End Date Taking? Authorizing Provider  Alum & Mag Hydroxide-Simeth (MAALOX ADVANCED PO) Take 1 tablet by mouth 3 times/day as needed-between meals & bedtime (gi distress).    Yes [provider]  ferrous sulfate 325 (65 FE) MG tablet Take 325 mg by mouth 3 (three) times daily with meals.   Yes [provider]  Glecaprevir-Pibrentasvir (MAVYRET) 100-40 MG TABS Take 3 tablets by mouth daily with breakfast. 03/18/21   Morgan Hicks B, MD  lidocaine (LIDODERM) 5 % Place 1 patch onto the skin once daily. Remove and discard patch within 12 hours or as directed by MD. 10/25/20  Yes Marrion Coy, MD  traMADol (ULTRAM) 50 MG tablet Take 1 tablet (50 mg total) by mouth every 6 (six) hours as needed for severe pain. 10/25/20  Yes Marrion Coy, MD  vitamin B-12 (CYANOCOBALAMIN) 1000 MCG tablet Take 1 tablet (1,000 mcg total) by mouth daily. 10/25/20  Yes Marrion Coy, MD  gabapentin (NEURONTIN) 100 MG capsule Take 1 capsule (100 mg total) by mouth 2 (two) times daily. 10/25/20 11/27/20  Marrion Coy, MD  pantoprazole (PROTONIX) 40 MG tablet Take 1 tablet (40 mg total) by mouth 2 (two) times daily. 10/25/20 11/27/20  Marrion Coy, MD    Family History  Problem Relation Age of Onset   Stomach cancer Mother 3   Cervical cancer Mother    Cirrhosis Father    Breast cancer Sister 25   Throat cancer Other      Social History   Tobacco Use   Smoking status: Every Day    Years: 10.00    Types: Cigarettes   Smokeless tobacco: Never   Tobacco comments:    5 cigerattes per day  Vaping Use   Vaping Use: Never used  Substance Use Topics   Alcohol use: No   Drug use: Never    Allergies as of 03/18/2021 - Review Complete 03/18/2021  Allergen Reaction Noted   Codeine Itching 10/05/2016   Percocet [oxycodone-acetaminophen]  02/24/2018    Physical Examination:  Constitutional: General:   Alert,  Well-developed, well-nourished, pleasant and  cooperative in NAD BP (!) 147/76   Pulse 85   Temp 98.3 F (36.8 C) (Oral)   Wt 226 lb (102.5 kg)   BMI 33.37 kg/m   Respiratory: Normal respiratory effort  Gastrointestinal:  Soft, non-tender and non-distended without masses, hepatosplenomegaly or hernias noted.  No guarding or rebound tenderness.     Cardiac: No clubbing or edema.  No cyanosis. Normal posterior tibial pedal pulses noted.  Psych:  Alert and cooperative. Normal mood and affect.  Musculoskeletal:  Normal gait. Head normocephalic, atraumatic. Symmetrical without gross deformities. 5/5 Lower extremity strength bilaterally.  Skin: Warm. Intact without significant lesions or  rashes. No jaundice.  Neck: Supple, trachea midline  Lymph: No cervical lymphadenopathy  Psych:  Alert and oriented x3, Alert and cooperative. Normal mood and affect.  Labs: CMP     Component Value Date/Time   NA 142 10/25/2020 0511   NA 141 02/28/2014 1204   K 3.3 (L) 10/25/2020 0511   K 3.7 02/28/2014 1204   CL 112 (H) 10/25/2020 0511   CL 111 (H) 02/28/2014 1204   CO2 24 10/25/2020 0511   CO2 21 02/28/2014 1204   GLUCOSE 132 (H) 10/25/2020 0511   GLUCOSE 148 (H) 02/28/2014 1204   BUN 8 10/25/2020 0511   BUN 13 02/28/2014 1204   CREATININE 0.83 10/25/2020 0511   CREATININE 1.19 02/28/2014 1204   CALCIUM 8.2 (L) 10/25/2020 0511   CALCIUM 9.0 02/28/2014 1204   PROT 7.5 10/23/2020 2059   PROT 7.9 02/28/2014 1204   ALBUMIN 3.5 10/23/2020 2059   ALBUMIN 2.9 (L) 02/28/2014 1204   AST 25 10/23/2020 2059   AST 73 (H) 02/28/2014 1204   ALT 23 10/23/2020 2059   ALT 87 (H) 02/28/2014 1204   ALKPHOS 73 10/23/2020 2059   ALKPHOS 94 02/28/2014 1204   BILITOT 1.5 (H) 10/23/2020 2059   BILITOT 0.5 02/28/2014 1204   GFRNONAA >60 10/25/2020 0511   GFRNONAA 54 (L) 02/28/2014 1204   GFRAA >60 06/12/2018 1432   GFRAA >60 02/28/2014 1204   Lab Results  Component Value Date   WBC 4.1 12/15/2020   HGB 11.7 12/15/2020   HCT 35.6 12/15/2020   MCV 90 12/15/2020   PLT 347 12/15/2020    Imaging Studies:   Assessment and Plan:   Morgan Hicks is a 55 y.o. y/o female here for follow-up of iron deficiency anemia and hepatitis C  Reschedule colonoscopy as patient canceled her last procedure due to death in the family.  Patient is willing to schedule at this time.  However, patient states she has to rush out of the office as she has to pick up her grandchildren and can schedule it over the phone.  She also needs to start treatment for hepatitis C.  Hep C RNA level 10,400,000, genotype Ia.  FibroSure level of 0.  I talked to Ginger and patient has to sign a form to get the treatment  approved.  Ginger called the patient, and patient will come back to the clinic to sign the form to start the treatment approval process  Importance of compliance with the above discussed  Patient was advised to call hematology since she was a no-show for her previous appointments to schedule a visit as well   I have discussed alternative options, risks & benefits,  which include, but are not limited to, bleeding, infection, perforation,respiratory complication & drug reaction.  The patient agrees with this plan & written consent will be obtained.  Dr Vonda Antigua

## 2021-03-24 ENCOUNTER — Other Ambulatory Visit (HOSPITAL_COMMUNITY): Payer: Self-pay

## 2023-08-15 ENCOUNTER — Other Ambulatory Visit: Payer: Self-pay | Admitting: Family Medicine

## 2023-08-15 DIAGNOSIS — B182 Chronic viral hepatitis C: Secondary | ICD-10-CM

## 2023-08-15 DIAGNOSIS — Z1231 Encounter for screening mammogram for malignant neoplasm of breast: Secondary | ICD-10-CM

## 2023-08-19 ENCOUNTER — Ambulatory Visit
Admission: RE | Admit: 2023-08-19 | Discharge: 2023-08-19 | Disposition: A | Discharge status: Home / Self Care | Payer: Medicaid Other | Source: Ambulatory Visit | Attending: Family Medicine | Admitting: Family Medicine

## 2023-08-19 DIAGNOSIS — B182 Chronic viral hepatitis C: Secondary | ICD-10-CM | POA: Insufficient documentation

## 2024-03-14 ENCOUNTER — Telehealth: Payer: Self-pay | Admitting: *Deleted

## 2024-03-14 NOTE — Telephone Encounter (Signed)
 I spoke to [facility] and referrals and if Dr. Tobie can fax the patient's information and what is needed they would try to get her in next week.  She says that is great and she will be sending it this afternoon.  Dr. Tobie is going to tell her that they are going to be getting an appointment and when Delon gets that appointment she will call the patient also

## 2024-03-15 ENCOUNTER — Encounter: Payer: Self-pay | Admitting: Family Medicine

## 2024-03-26 ENCOUNTER — Encounter: Payer: Self-pay | Admitting: Internal Medicine

## 2024-03-26 ENCOUNTER — Inpatient Hospital Stay

## 2024-03-26 ENCOUNTER — Inpatient Hospital Stay: Attending: Internal Medicine | Admitting: Internal Medicine

## 2024-03-26 VITALS — BP 120/96 | HR 103 | Temp 100.6°F | Resp 16 | Ht 69.0 in | Wt 219.2 lb

## 2024-03-26 DIAGNOSIS — B1920 Unspecified viral hepatitis C without hepatic coma: Secondary | ICD-10-CM | POA: Insufficient documentation

## 2024-03-26 DIAGNOSIS — D509 Iron deficiency anemia, unspecified: Secondary | ICD-10-CM | POA: Insufficient documentation

## 2024-03-26 DIAGNOSIS — D649 Anemia, unspecified: Secondary | ICD-10-CM

## 2024-03-26 NOTE — Progress Notes (Signed)
 Fatigue/weakness: YES Dyspena: NO-COUGH Light headedness: NO Blood in stool: NO   Pt was taking iron  tid, stopped due to making her nauseous.

## 2024-03-26 NOTE — Assessment & Plan Note (Addendum)
#  Anemia hemoglobin 7-8; MCV 80- [chronic]- [SEP 2025- PCP- ferritin-13; I sat- 6].  Poor tolerance to oral iron .  # Discussed the potential acute infusion reactions with IV iron ; which are quite rare.  Patient understands the risk; will proceed with infusions.  # 202- Gastric wall thickening-noted on the CT of the abdomen pelvis/also mild periportal adenopathy-recommend EGD for further evaluation.  Recommend GI evaluation.  Menopause more than 15 years ago.  #Hepatitis C untreated-await GI evaluation.  Thank you Dr. Tobie. for allowing me to participate in the care of your pleasant patient. Please do not hesitate to contact me with questions or concerns in the interim.   # DISPOSITION: # no labs today # referral to [facility]-  GI re: gastric thickening/ Iron  deficiency-  # venofer - weekly x 4 - start latre this week if possible.  # follow up in 3 months/- MD: labs-cbc/bmp; iron  studies; ferritin- possible venofer - Dr.B

## 2024-03-26 NOTE — Progress Notes (Signed)
  [facility] Center CONSULT NOTE  Patient Care Team: Tobie Domino, MD as PCP - General (Family Medicine) Cindie Jesusa HERO, RN as Registered Nurse Dannielle Arlean FALCON, RN (Inactive) as Registered Nurse Rennie Cindy SAUNDERS, MD as Consulting Physician (Oncology)  CHIEF COMPLAINTS/PURPOSE OF CONSULTATION: Anemia  # AUG 2019-anemia 7-8;  MCV 80; electrophoresis-normal/PCP; ferritin 15; creat 0.88; LFTs-N; LDH- N;   #Hepatitis C positive [HCV >11]/ B-12/ folate; active smoker  #Active smoker. Oncology History   No history exists.     HISTORY OF PRESENTING ILLNESS: Patient ambulating-independently. Alone   Morgan Hicks 58 y.o.  female with no significant past medical history except for untreated hep C/active smoker-history of microcytic anemia-unclear etiology is here for follow-up.  Patient continues to complain of worsening fatigue.  Denies any blood in stools or black or stools.  Complains of poor appetite.  Complains of shortness of breath on exertion.  Patient denies any prior colonoscopy.  Denies any prior EGDs.  Denies any difficulty swallowing painful swallowing.  No weight loss.  Review of Systems  Constitutional:  Positive for malaise/fatigue and weight loss. Negative for chills, diaphoresis and fever.  HENT:  Negative for nosebleeds and sore throat.   Eyes:  Negative for double vision.  Respiratory:  Positive for shortness of breath. Negative for cough, hemoptysis, sputum production and wheezing.   Cardiovascular:  Negative for chest pain, palpitations, orthopnea and leg swelling.  Gastrointestinal:  Negative for abdominal pain, blood in stool, constipation, diarrhea, heartburn, melena, nausea and vomiting.  Genitourinary:  Negative for dysuria, frequency and urgency.  Musculoskeletal:  Positive for back pain and myalgias. Negative for joint pain.  Skin: Negative.  Negative for itching and rash.  Neurological:  Negative for dizziness, tingling, focal weakness, weakness  and headaches.  Endo/Heme/Allergies:  Does not bruise/bleed easily.  Psychiatric/Behavioral:  Negative for depression. The patient is not nervous/anxious and does not have insomnia.      MEDICAL HISTORY:  Past Medical History:  Diagnosis Date   Atopic dermatitis    Constipation    Depression    GERD (gastroesophageal reflux disease)    Hepatitis C    Microcytic anemia    Sciatica    Tobacco use     SURGICAL HISTORY: Past Surgical History:  Procedure Laterality Date   ESOPHAGOGASTRODUODENOSCOPY N/A 10/24/2020   Procedure: ESOPHAGOGASTRODUODENOSCOPY (EGD);  Surgeon: Janalyn Keene NOVAK, MD;  Location: [facility] ENDOSCOPY;  Service: Endoscopy;  Laterality: N/A;   paraguard IUD surgery  05/22/2002    SOCIAL HISTORY: Social History   Socioeconomic History   Marital status: Single    Spouse name: Not on file   Number of children: Not on file   Years of education: Not on file   Highest education level: Not on file  Occupational History   Not on file  Tobacco Use   Smoking status: Every Day    Types: Cigarettes   Smokeless tobacco: Never   Tobacco comments:    5 cigerattes per day  Vaping Use   Vaping status: Never Used  Substance and Sexual Activity   Alcohol use: No   Drug use: Never   Sexual activity: Not on file  Other Topics Concern   Not on file  Social History Narrative   In health care; alochol/beer every other day; smoke 5 cig/day; in [place]   Social Drivers of Health   Financial Resource Strain: Not on file  Food Insecurity: No Food Insecurity (03/26/2024)   Hunger Vital Sign    Worried About  Running Out of Food in the Last Year: Never true    Ran Out of Food in the Last Year: Never true  Transportation Needs: No Transportation Needs (03/26/2024)   PRAPARE - Administrator, Civil Service (Medical): No    Lack of Transportation (Non-Medical): No  Physical Activity: Not on file  Stress: Not on file  Social Connections: Not on file  Intimate  Partner Violence: Not At Risk (03/26/2024)   Humiliation, Afraid, Rape, and Kick questionnaire    Fear of Current or Ex-Partner: No    Emotionally Abused: No    Physically Abused: No    Sexually Abused: No    FAMILY HISTORY: Family History  Problem Relation Age of Onset   Stomach cancer Mother 58   Cervical cancer Mother    Cirrhosis Father    Breast cancer Sister 60   Throat cancer Other     ALLERGIES:  is allergic to codeine and percocet [oxycodone-acetaminophen ].  MEDICATIONS:  Current Outpatient Medications  Medication Sig Dispense Refill   Alum & Mag Hydroxide-Simeth (MAALOX ADVANCED PO) Take 1 tablet by mouth 3 times/day as needed-between meals & bedtime (gi distress).      gabapentin  (NEURONTIN ) 100 MG capsule Take 1 capsule (100 mg total) by mouth 2 (two) times daily. 60 capsule 0   traMADol  (ULTRAM ) 50 MG tablet Take 1 tablet (50 mg total) by mouth every 6 (six) hours as needed for severe pain. 12 tablet 0   Glecaprevir-Pibrentasvir (MAVYRET ) 100-40 MG TABS Take 3 tablets by mouth daily with breakfast. (Patient not taking: Reported on 03/26/2024) 84 tablet 1   tiZANidine (ZANAFLEX) 4 MG tablet Take 4 mg by mouth at bedtime.     No current facility-administered medications for this visit.      SABRA  PHYSICAL EXAMINATION: ECOG PERFORMANCE STATUS: 1 - Symptomatic but completely ambulatory  Vitals:   03/26/24 1348  BP: (!) 120/96  Pulse: (!) 103  Resp: 16  Temp: (!) 100.6 F (38.1 C)  SpO2: 98%   Filed Weights   03/26/24 1348  Weight: 219 lb 3.2 oz (99.4 kg)    Physical Exam HENT:     Head: Normocephalic and atraumatic.     Mouth/Throat:     Pharynx: No oropharyngeal exudate.  Eyes:     Pupils: Pupils are equal, round, and reactive to light.  Cardiovascular:     Rate and Rhythm: Normal rate and regular rhythm.  Pulmonary:     Effort: No respiratory distress.     Breath sounds: No wheezing.  Abdominal:     General: Bowel sounds are normal. There is no  distension.     Palpations: Abdomen is soft. There is no mass.     Tenderness: There is no abdominal tenderness. There is no guarding or rebound.  Musculoskeletal:        General: No tenderness. Normal range of motion.     Cervical back: Normal range of motion and neck supple.  Skin:    General: Skin is warm.  Neurological:     Mental Status: She is alert and oriented to person, place, and time.  Psychiatric:        Mood and Affect: Affect normal.      LABORATORY DATA:  I have reviewed the data as listed Lab Results  Component Value Date   WBC 4.1 12/15/2020   HGB 11.7 12/15/2020   HCT 35.6 12/15/2020   MCV 90 12/15/2020   PLT 347 12/15/2020   No results  for input(s): NA, K, CL, CO2, GLUCOSE, BUN, CREATININE, CALCIUM, GFRNONAA, GFRAA, PROT, ALBUMIN, AST, ALT, ALKPHOS, BILITOT, BILIDIR, IBILI in the last 8760 hours.   RADIOGRAPHIC STUDIES: I have personally reviewed the radiological images as listed and agreed with the findings in the report. No results found.   ASSESSMENT & PLAN:   Symptomatic anemia #Anemia hemoglobin 7-8; MCV 80- [chronic]- [SEP 2025- PCP- ferritin-13; I sat- 6].  Poor tolerance to oral iron .  # Discussed the potential acute infusion reactions with IV iron ; which are quite rare.  Patient understands the risk; will proceed with infusions.  # 202- Gastric wall thickening-noted on the CT of the abdomen pelvis/also mild periportal adenopathy-recommend EGD for further evaluation.  Recommend GI evaluation.  Menopause more than 15 years ago.  #Hepatitis C untreated-await GI evaluation.  Thank you Dr. Tobie. for allowing me to participate in the care of your pleasant patient. Please do not hesitate to contact me with questions or concerns in the interim.   # DISPOSITION: # no labs today # referral to [facility]-  GI re: gastric thickening/ Iron  deficiency-  # venofer - weekly x 4 - start latre this week if possible.  # follow  up in 3 months/- MD: labs-cbc/bmp; iron  studies; ferritin- possible venofer - Dr.B   All questions were answered. The patient knows to call the clinic with any problems, questions or concerns.    Cindy JONELLE Joe, MD 03/26/2024 4:08 PM

## 2024-03-29 ENCOUNTER — Inpatient Hospital Stay

## 2024-04-05 ENCOUNTER — Ambulatory Visit

## 2024-04-10 ENCOUNTER — Telehealth: Payer: Self-pay

## 2024-04-10 NOTE — Telephone Encounter (Signed)
 Spoke with Morgan Hicks at [facility] GI, pt has appt sch'd with Dr. Therisa 04/25/24.

## 2024-04-12 ENCOUNTER — Ambulatory Visit

## 2024-04-19 ENCOUNTER — Inpatient Hospital Stay

## 2024-04-30 ENCOUNTER — Telehealth: Payer: Self-pay | Admitting: Internal Medicine

## 2024-04-30 NOTE — Telephone Encounter (Signed)
 Patient called to ask if she could come for her missed iron  infusions . She was scheduled 10/9,16,23 and 30 and no showed all of them.  She was a new patient with follow up 1/6.  Please advise.

## 2024-05-07 NOTE — Progress Notes (Unsigned)
 [facility] CONSULT NOTE  Patient Care Team: Tobie Domino, MD as PCP - General (Family Medicine) Cindie Jesusa HERO, RN as Registered Nurse Dannielle Arlean FALCON, RN (Inactive) as Registered Nurse Rennie Cindy SAUNDERS, MD as Consulting Physician (Oncology)  CHIEF COMPLAINTS/PURPOSE OF CONSULTATION: Anemia  # AUG 2019-anemia 7-8;  MCV 80; electrophoresis-normal/PCP; ferritin 15; creat 0.88; LFTs-N; LDH- N;   #Hepatitis C positive [HCV >11]/ B-12/ folate; active smoker  #Active smoker. Oncology History   No history exists.     HISTORY OF PRESENTING ILLNESS: Patient ambulating-independently. Alone   Morgan Hicks 58 y.o.  female with no significant past medical history except for untreated hep C/active smoker-history of microcytic anemia-unclear etiology is here for follow-up.  Patient continues to complain of worsening fatigue.  Denies any blood in stools or black or stools.  Complains of poor appetite.  Complains of shortness of breath on exertion.  Patient denies any prior colonoscopy.  Denies any prior EGDs.  Denies any difficulty swallowing painful swallowing.  No weight loss.  Review of Systems  Constitutional:  Positive for malaise/fatigue and weight loss. Negative for chills, diaphoresis and fever.  HENT:  Negative for nosebleeds and sore throat.   Eyes:  Negative for double vision.  Respiratory:  Positive for shortness of breath. Negative for cough, hemoptysis, sputum production and wheezing.   Cardiovascular:  Negative for chest pain, palpitations, orthopnea and leg swelling.  Gastrointestinal:  Negative for abdominal pain, blood in stool, constipation, diarrhea, heartburn, melena, nausea and vomiting.  Genitourinary:  Negative for dysuria, frequency and urgency.  Musculoskeletal:  Positive for back pain and myalgias. Negative for joint pain.  Skin: Negative.  Negative for itching and rash.  Neurological:  Negative for dizziness, tingling, focal weakness, weakness  and headaches.  Endo/Heme/Allergies:  Does not bruise/bleed easily.  Psychiatric/Behavioral:  Negative for depression. The patient is not nervous/anxious and does not have insomnia.      MEDICAL HISTORY:  Past Medical History:  Diagnosis Date  . Atopic dermatitis   . Constipation   . Depression   . GERD (gastroesophageal reflux disease)   . Hepatitis C   . Microcytic anemia   . Sciatica   . Tobacco use     SURGICAL HISTORY: Past Surgical History:  Procedure Laterality Date  . ESOPHAGOGASTRODUODENOSCOPY N/A 10/24/2020   Procedure: ESOPHAGOGASTRODUODENOSCOPY (EGD);  Surgeon: Janalyn Keene NOVAK, MD;  Location[facility] ENDOSCOPY;  Service: Endoscopy;  Laterality: N/A;  . paraguard IUD surgery  05/22/2002    SOCIAL HISTORY: Social History   Socioeconomic History  . Marital status: Single    Spouse name: Not on file  . Number of children: Not on file  . Years of education: Not on file  . Highest education level: Not on file  Occupational History  . Not on file  Tobacco Use  . Smoking status: Every Day    Types: Cigarettes  . Smokeless tobacco: Never  . Tobacco comments:    5 cigerattes per day  Vaping Use  . Vaping status: Never Used  Substance and Sexual Activity  . Alcohol use: No  . Drug use: Never  . Sexual activity: Not on file  Other Topics Concern  . Not on file  Social History Narrative   In health care; alochol/beer every other day; smoke 5 cig/day; in [place]   Social Drivers of Health   Financial Resource Strain: Not on file  Food Insecurity: No Food Insecurity (03/26/2024)   Hunger Vital Sign   . Worried About  Running Out of Food in the Last Year: Never true   . Ran Out of Food in the Last Year: Never true  Transportation Needs: No Transportation Needs (03/26/2024)   PRAPARE - Transportation   . Lack of Transportation (Medical): No   . Lack of Transportation (Non-Medical): No  Physical Activity: Not on file  Stress: Not on file  Social  Connections: Not on file  Intimate Partner Violence: Not At Risk (03/26/2024)   Humiliation, Afraid, Rape, and Kick questionnaire   . Fear of Current or Ex-Partner: No   . Emotionally Abused: No   . Physically Abused: No   . Sexually Abused: No    FAMILY HISTORY: Family History  Problem Relation Age of Onset  . Stomach cancer Mother 5  . Cervical cancer Mother   . Cirrhosis Father   . Breast cancer Sister 38  . Throat cancer Other     ALLERGIES:  is allergic to codeine and percocet [oxycodone-acetaminophen ].  MEDICATIONS:  Current Outpatient Medications  Medication Sig Dispense Refill  . Alum & Mag Hydroxide-Simeth (MAALOX ADVANCED PO) Take 1 tablet by mouth 3 times/day as needed-between meals & bedtime (gi distress).     . gabapentin  (NEURONTIN ) 100 MG capsule Take 1 capsule (100 mg total) by mouth 2 (two) times daily. 60 capsule 0  . Glecaprevir-Pibrentasvir (MAVYRET ) 100-40 MG TABS Take 3 tablets by mouth daily with breakfast. (Patient not taking: Reported on 03/26/2024) 84 tablet 1  . tiZANidine (ZANAFLEX) 4 MG tablet Take 4 mg by mouth at bedtime.    . traMADol  (ULTRAM ) 50 MG tablet Take 1 tablet (50 mg total) by mouth every 6 (six) hours as needed for severe pain. 12 tablet 0   No current facility-administered medications for this visit.      SABRA  PHYSICAL EXAMINATION: ECOG PERFORMANCE STATUS: 1 - Symptomatic but completely ambulatory  There were no vitals filed for this visit.  There were no vitals filed for this visit.   Physical Exam HENT:     Head: Normocephalic and atraumatic.     Mouth/Throat:     Pharynx: No oropharyngeal exudate.  Eyes:     Pupils: Pupils are equal, round, and reactive to light.  Cardiovascular:     Rate and Rhythm: Normal rate and regular rhythm.  Pulmonary:     Effort: No respiratory distress.     Breath sounds: No wheezing.  Abdominal:     General: Bowel sounds are normal. There is no distension.     Palpations: Abdomen is soft.  There is no mass.     Tenderness: There is no abdominal tenderness. There is no guarding or rebound.  Musculoskeletal:        General: No tenderness. Normal range of motion.     Cervical back: Normal range of motion and neck supple.  Skin:    General: Skin is warm.  Neurological:     Mental Status: She is alert and oriented to person, place, and time.  Psychiatric:        Mood and Affect: Affect normal.      LABORATORY DATA:  I have reviewed the data as listed Lab Results  Component Value Date   WBC 4.1 12/15/2020   HGB 11.7 12/15/2020   HCT 35.6 12/15/2020   MCV 90 12/15/2020   PLT 347 12/15/2020   No results for input(s): NA, K, CL, CO2, GLUCOSE, BUN, CREATININE, CALCIUM, GFRNONAA, GFRAA, PROT, ALBUMIN, AST, ALT, ALKPHOS, BILITOT, BILIDIR, IBILI in the last 8760 hours.  RADIOGRAPHIC STUDIES: I have personally reviewed the radiological images as listed and agreed with the findings in the report. No results found.   ASSESSMENT & PLAN:   No problem-specific Assessment & Plan notes found for this encounter.    All questions were answered. The patient knows to call the clinic with any problems, questions or concerns.    Morna Husband, NP 05/07/2024 10:43 PM

## 2024-05-08 ENCOUNTER — Inpatient Hospital Stay

## 2024-05-08 ENCOUNTER — Inpatient Hospital Stay (HOSPITAL_BASED_OUTPATIENT_CLINIC_OR_DEPARTMENT_OTHER): Admitting: Nurse Practitioner

## 2024-05-08 ENCOUNTER — Inpatient Hospital Stay: Attending: Internal Medicine

## 2024-05-08 ENCOUNTER — Other Ambulatory Visit: Payer: Self-pay | Admitting: *Deleted

## 2024-05-08 ENCOUNTER — Encounter: Payer: Self-pay | Admitting: Nurse Practitioner

## 2024-05-08 VITALS — BP 140/67 | HR 78 | Temp 96.6°F | Resp 12 | Ht 69.0 in | Wt 223.0 lb

## 2024-05-08 DIAGNOSIS — D500 Iron deficiency anemia secondary to blood loss (chronic): Secondary | ICD-10-CM

## 2024-05-08 DIAGNOSIS — D649 Anemia, unspecified: Secondary | ICD-10-CM

## 2024-05-08 DIAGNOSIS — D509 Iron deficiency anemia, unspecified: Secondary | ICD-10-CM | POA: Insufficient documentation

## 2024-05-08 LAB — CBC WITH DIFFERENTIAL (CANCER CENTER ONLY)
Abs Immature Granulocytes: 0.01 K/uL (ref 0.00–0.07)
Basophils Absolute: 0 K/uL (ref 0.0–0.1)
Basophils Relative: 1 %
Eosinophils Absolute: 0.2 K/uL (ref 0.0–0.5)
Eosinophils Relative: 5 %
HCT: 24.5 % — ABNORMAL LOW (ref 36.0–46.0)
Hemoglobin: 7 g/dL — ABNORMAL LOW (ref 12.0–15.0)
Immature Granulocytes: 0 %
Lymphocytes Relative: 33 %
Lymphs Abs: 1.4 K/uL (ref 0.7–4.0)
MCH: 22.9 pg — ABNORMAL LOW (ref 26.0–34.0)
MCHC: 28.6 g/dL — ABNORMAL LOW (ref 30.0–36.0)
MCV: 80.1 fL (ref 80.0–100.0)
Monocytes Absolute: 0.3 K/uL (ref 0.1–1.0)
Monocytes Relative: 7 %
Neutro Abs: 2.3 K/uL (ref 1.7–7.7)
Neutrophils Relative %: 54 %
Platelet Count: 268 K/uL (ref 150–400)
RBC: 3.06 MIL/uL — ABNORMAL LOW (ref 3.87–5.11)
RDW: 19.2 % — ABNORMAL HIGH (ref 11.5–15.5)
WBC Count: 4.2 K/uL (ref 4.0–10.5)
nRBC: 0 % (ref 0.0–0.2)

## 2024-05-08 LAB — PREPARE RBC (CROSSMATCH)

## 2024-05-08 LAB — BASIC METABOLIC PANEL WITH GFR
Anion gap: 9 (ref 5–15)
BUN: 8 mg/dL (ref 6–20)
CO2: 22 mmol/L (ref 22–32)
Calcium: 8.5 mg/dL — ABNORMAL LOW (ref 8.9–10.3)
Chloride: 108 mmol/L (ref 98–111)
Creatinine, Ser: 0.79 mg/dL (ref 0.44–1.00)
GFR, Estimated: >60 mL/min (ref >60)
Glucose, Bld: 139 mg/dL — ABNORMAL HIGH (ref 70–99)
Potassium: 3.6 mmol/L (ref 3.5–5.1)
Sodium: 139 mmol/L (ref 135–145)

## 2024-05-08 LAB — FERRITIN: Ferritin: 13 ng/mL (ref 11–307)

## 2024-05-08 LAB — IRON AND TIBC
Iron: 15 ug/dL — ABNORMAL LOW (ref 28–170)
Saturation Ratios: 3 % — ABNORMAL LOW (ref 10.4–31.8)
TIBC: 517 ug/dL — ABNORMAL HIGH (ref 250–450)
UIBC: 502 ug/dL

## 2024-05-09 ENCOUNTER — Inpatient Hospital Stay

## 2024-05-09 DIAGNOSIS — D509 Iron deficiency anemia, unspecified: Secondary | ICD-10-CM | POA: Diagnosis not present

## 2024-05-09 DIAGNOSIS — D500 Iron deficiency anemia secondary to blood loss (chronic): Secondary | ICD-10-CM

## 2024-05-09 MED ORDER — DIPHENHYDRAMINE HCL 25 MG PO CAPS
25.0000 mg | ORAL_CAPSULE | Freq: Once | ORAL | Status: AC
  Administered 2024-05-09: 25 mg via ORAL
  Filled 2024-05-09: qty 1

## 2024-05-09 MED ORDER — ACETAMINOPHEN 325 MG PO TABS
650.0000 mg | ORAL_TABLET | Freq: Once | ORAL | Status: AC
  Administered 2024-05-09: 650 mg via ORAL
  Filled 2024-05-09: qty 2

## 2024-05-09 MED ORDER — SODIUM CHLORIDE 0.9% IV SOLUTION
250.0000 mL | INTRAVENOUS | Status: DC
  Administered 2024-05-09: 100 mL via INTRAVENOUS
  Filled 2024-05-09: qty 250

## 2024-05-09 NOTE — Patient Instructions (Signed)

## 2024-05-10 ENCOUNTER — Inpatient Hospital Stay

## 2024-05-10 VITALS — BP 138/67 | HR 78 | Temp 97.3°F | Resp 18

## 2024-05-10 DIAGNOSIS — D500 Iron deficiency anemia secondary to blood loss (chronic): Secondary | ICD-10-CM

## 2024-05-10 DIAGNOSIS — D649 Anemia, unspecified: Secondary | ICD-10-CM

## 2024-05-10 DIAGNOSIS — D509 Iron deficiency anemia, unspecified: Secondary | ICD-10-CM | POA: Diagnosis not present

## 2024-05-10 LAB — TYPE AND SCREEN
ABO/RH(D): A POS
Antibody Screen: NEGATIVE
Unit division: 0

## 2024-05-10 LAB — CBC WITH DIFFERENTIAL (CANCER CENTER ONLY)
Abs Immature Granulocytes: 0.05 K/uL (ref 0.00–0.07)
Basophils Absolute: 0 K/uL (ref 0.0–0.1)
Basophils Relative: 1 %
Eosinophils Absolute: 0.2 K/uL (ref 0.0–0.5)
Eosinophils Relative: 4 %
HCT: 28.4 % — ABNORMAL LOW (ref 36.0–46.0)
Hemoglobin: 8.4 g/dL — ABNORMAL LOW (ref 12.0–15.0)
Immature Granulocytes: 1 %
Lymphocytes Relative: 27 %
Lymphs Abs: 1.3 K/uL (ref 0.7–4.0)
MCH: 23.3 pg — ABNORMAL LOW (ref 26.0–34.0)
MCHC: 29.6 g/dL — ABNORMAL LOW (ref 30.0–36.0)
MCV: 78.7 fL — ABNORMAL LOW (ref 80.0–100.0)
Monocytes Absolute: 0.4 K/uL (ref 0.1–1.0)
Monocytes Relative: 9 %
Neutro Abs: 2.9 K/uL (ref 1.7–7.7)
Neutrophils Relative %: 58 %
Platelet Count: 237 K/uL (ref 150–400)
RBC: 3.61 MIL/uL — ABNORMAL LOW (ref 3.87–5.11)
RDW: 18.2 % — ABNORMAL HIGH (ref 11.5–15.5)
WBC Count: 4.8 K/uL (ref 4.0–10.5)
nRBC: 0 % (ref 0.0–0.2)

## 2024-05-10 LAB — BPAM RBC
Blood Product Expiration Date: 202512122359
ISSUE DATE / TIME: 202511190900
Unit Type and Rh: 202512122359
Unit Type and Rh: 6200

## 2024-05-10 MED ORDER — IRON SUCROSE 20 MG/ML IV SOLN
200.0000 mg | Freq: Once | INTRAVENOUS | Status: AC
  Administered 2024-05-10: 200 mg via INTRAVENOUS
  Filled 2024-05-10: qty 10

## 2024-05-10 MED ORDER — SODIUM CHLORIDE 0.9% FLUSH
10.0000 mL | Freq: Once | INTRAVENOUS | Status: AC | PRN
  Administered 2024-05-10: 10 mL
  Filled 2024-05-10: qty 10

## 2024-05-14 ENCOUNTER — Inpatient Hospital Stay

## 2024-05-14 ENCOUNTER — Encounter: Payer: Self-pay | Admitting: Internal Medicine

## 2024-05-15 ENCOUNTER — Inpatient Hospital Stay

## 2024-05-15 ENCOUNTER — Inpatient Hospital Stay: Admitting: Nurse Practitioner

## 2024-05-15 VITALS — BP 119/63 | HR 75 | Temp 95.0°F | Resp 18

## 2024-05-15 DIAGNOSIS — D649 Anemia, unspecified: Secondary | ICD-10-CM

## 2024-05-15 DIAGNOSIS — D500 Iron deficiency anemia secondary to blood loss (chronic): Secondary | ICD-10-CM

## 2024-05-15 DIAGNOSIS — D509 Iron deficiency anemia, unspecified: Secondary | ICD-10-CM | POA: Diagnosis not present

## 2024-05-15 LAB — CBC WITH DIFFERENTIAL (CANCER CENTER ONLY)
Abs Immature Granulocytes: 0.01 K/uL (ref 0.00–0.07)
Basophils Absolute: 0 K/uL (ref 0.0–0.1)
Basophils Relative: 1 %
Eosinophils Absolute: 0.2 K/uL (ref 0.0–0.5)
Eosinophils Relative: 4 %
HCT: 28.5 % — ABNORMAL LOW (ref 36.0–46.0)
Hemoglobin: 8.5 g/dL — ABNORMAL LOW (ref 12.0–15.0)
Immature Granulocytes: 0 %
Lymphocytes Relative: 29 %
Lymphs Abs: 1.4 K/uL (ref 0.7–4.0)
MCH: 24.3 pg — ABNORMAL LOW (ref 26.0–34.0)
MCHC: 29.8 g/dL — ABNORMAL LOW (ref 30.0–36.0)
MCV: 81.4 fL (ref 80.0–100.0)
Monocytes Absolute: 0.4 K/uL (ref 0.1–1.0)
Monocytes Relative: 9 %
Neutro Abs: 2.9 K/uL (ref 1.7–7.7)
Neutrophils Relative %: 57 %
Platelet Count: 231 K/uL (ref 150–400)
RBC: 3.5 MIL/uL — ABNORMAL LOW (ref 3.87–5.11)
RDW: 20.8 % — ABNORMAL HIGH (ref 11.5–15.5)
WBC Count: 5 K/uL (ref 4.0–10.5)
nRBC: 0.6 % — ABNORMAL HIGH (ref 0.0–0.2)

## 2024-05-15 LAB — CMP (CANCER CENTER ONLY)
ALT: 25 U/L (ref 0–44)
AST: 37 U/L (ref 15–41)
Albumin: 3 g/dL — ABNORMAL LOW (ref 3.5–5.0)
Alkaline Phosphatase: 75 U/L (ref 38–126)
Anion gap: 9 (ref 5–15)
BUN: 11 mg/dL (ref 6–20)
CO2: 21 mmol/L — ABNORMAL LOW (ref 22–32)
Calcium: 8.4 mg/dL — ABNORMAL LOW (ref 8.9–10.3)
Chloride: 111 mmol/L (ref 98–111)
Creatinine: 0.87 mg/dL (ref 0.44–1.00)
GFR, Estimated: >60 mL/min (ref >60)
Glucose, Bld: 110 mg/dL — ABNORMAL HIGH (ref 70–99)
Potassium: 3.8 mmol/L (ref 3.5–5.1)
Sodium: 141 mmol/L (ref 135–145)
Total Bilirubin: 0.7 mg/dL (ref 0.0–1.2)
Total Protein: 7.3 g/dL (ref 6.5–8.1)

## 2024-05-15 LAB — SAMPLE TO BLOOD BANK

## 2024-05-15 MED ORDER — IRON SUCROSE 20 MG/ML IV SOLN
200.0000 mg | Freq: Once | INTRAVENOUS | Status: AC
  Administered 2024-05-15: 200 mg via INTRAVENOUS
  Filled 2024-05-15: qty 10

## 2024-05-15 MED ORDER — SODIUM CHLORIDE 0.9% FLUSH
10.0000 mL | Freq: Once | INTRAVENOUS | Status: AC | PRN
  Administered 2024-05-15: 10 mL
  Filled 2024-05-15: qty 10

## 2024-05-16 ENCOUNTER — Inpatient Hospital Stay

## 2024-05-22 ENCOUNTER — Ambulatory Visit: Admission: RE | Admit: 2024-05-22 | Source: Home / Self Care | Admitting: Internal Medicine

## 2024-05-22 SURGERY — COLONOSCOPY
Anesthesia: General

## 2024-05-23 ENCOUNTER — Other Ambulatory Visit: Payer: Self-pay | Admitting: *Deleted

## 2024-05-23 DIAGNOSIS — D649 Anemia, unspecified: Secondary | ICD-10-CM

## 2024-05-24 ENCOUNTER — Inpatient Hospital Stay

## 2024-05-24 ENCOUNTER — Inpatient Hospital Stay: Attending: Internal Medicine

## 2024-05-24 VITALS — BP 136/76 | HR 73 | Temp 96.5°F | Resp 18

## 2024-05-24 DIAGNOSIS — D649 Anemia, unspecified: Secondary | ICD-10-CM

## 2024-05-24 DIAGNOSIS — D509 Iron deficiency anemia, unspecified: Secondary | ICD-10-CM | POA: Diagnosis present

## 2024-05-24 DIAGNOSIS — R5383 Other fatigue: Secondary | ICD-10-CM | POA: Insufficient documentation

## 2024-05-24 DIAGNOSIS — R0602 Shortness of breath: Secondary | ICD-10-CM | POA: Diagnosis not present

## 2024-05-24 DIAGNOSIS — B1920 Unspecified viral hepatitis C without hepatic coma: Secondary | ICD-10-CM | POA: Insufficient documentation

## 2024-05-24 DIAGNOSIS — D500 Iron deficiency anemia secondary to blood loss (chronic): Secondary | ICD-10-CM

## 2024-05-24 LAB — SAMPLE TO BLOOD BANK

## 2024-05-24 LAB — CBC (CANCER CENTER ONLY)
HCT: 30.1 % — ABNORMAL LOW (ref 36.0–46.0)
Hemoglobin: 9 g/dL — ABNORMAL LOW (ref 12.0–15.0)
MCH: 25.3 pg — ABNORMAL LOW (ref 26.0–34.0)
MCHC: 29.9 g/dL — ABNORMAL LOW (ref 30.0–36.0)
MCV: 84.6 fL (ref 80.0–100.0)
Platelet Count: 290 K/uL (ref 150–400)
RBC: 3.56 MIL/uL — ABNORMAL LOW (ref 3.87–5.11)
RDW: 23.4 % — ABNORMAL HIGH (ref 11.5–15.5)
WBC Count: 4.4 K/uL (ref 4.0–10.5)
nRBC: 0 % (ref 0.0–0.2)

## 2024-05-24 MED ORDER — IRON SUCROSE 20 MG/ML IV SOLN
200.0000 mg | Freq: Once | INTRAVENOUS | Status: AC
  Administered 2024-05-24: 200 mg via INTRAVENOUS
  Filled 2024-05-24: qty 10

## 2024-05-28 ENCOUNTER — Other Ambulatory Visit: Payer: Self-pay | Admitting: *Deleted

## 2024-05-28 DIAGNOSIS — D649 Anemia, unspecified: Secondary | ICD-10-CM

## 2024-05-28 DIAGNOSIS — D500 Iron deficiency anemia secondary to blood loss (chronic): Secondary | ICD-10-CM

## 2024-05-29 ENCOUNTER — Inpatient Hospital Stay

## 2024-05-29 ENCOUNTER — Encounter: Payer: Self-pay | Admitting: Nurse Practitioner

## 2024-05-29 ENCOUNTER — Inpatient Hospital Stay: Admitting: Nurse Practitioner

## 2024-05-29 VITALS — BP 151/82 | HR 63

## 2024-05-29 VITALS — BP 151/69 | HR 66 | Temp 97.0°F | Ht 69.0 in | Wt 223.0 lb

## 2024-05-29 DIAGNOSIS — D509 Iron deficiency anemia, unspecified: Secondary | ICD-10-CM | POA: Diagnosis not present

## 2024-05-29 DIAGNOSIS — D649 Anemia, unspecified: Secondary | ICD-10-CM

## 2024-05-29 DIAGNOSIS — D500 Iron deficiency anemia secondary to blood loss (chronic): Secondary | ICD-10-CM

## 2024-05-29 LAB — CMP (CANCER CENTER ONLY)
ALT: 26 U/L (ref 0–44)
AST: 34 U/L (ref 15–41)
Albumin: 3.4 g/dL — ABNORMAL LOW (ref 3.5–5.0)
Alkaline Phosphatase: 78 U/L (ref 38–126)
Anion gap: 11 (ref 5–15)
BUN: 11 mg/dL (ref 6–20)
CO2: 20 mmol/L — ABNORMAL LOW (ref 22–32)
Calcium: 8.7 mg/dL — ABNORMAL LOW (ref 8.9–10.3)
Chloride: 115 mmol/L — ABNORMAL HIGH (ref 98–111)
Creatinine: 0.87 mg/dL (ref 0.44–1.00)
GFR, Estimated: >60 mL/min (ref >60)
Glucose, Bld: 174 mg/dL — ABNORMAL HIGH (ref 70–99)
Potassium: 3.6 mmol/L (ref 3.5–5.1)
Sodium: 146 mmol/L — ABNORMAL HIGH (ref 135–145)
Total Bilirubin: 0.3 mg/dL (ref 0.0–1.2)
Total Protein: 6.5 g/dL (ref 6.5–8.1)

## 2024-05-29 LAB — CBC WITH DIFFERENTIAL (CANCER CENTER ONLY)
Abs Immature Granulocytes: 0.01 K/uL (ref 0.00–0.07)
Basophils Absolute: 0 K/uL (ref 0.0–0.1)
Basophils Relative: 1 %
Eosinophils Absolute: 0.2 K/uL (ref 0.0–0.5)
Eosinophils Relative: 4 %
HCT: 30.8 % — ABNORMAL LOW (ref 36.0–46.0)
Hemoglobin: 9 g/dL — ABNORMAL LOW (ref 12.0–15.0)
Immature Granulocytes: 0 %
Lymphocytes Relative: 36 %
Lymphs Abs: 1.5 K/uL (ref 0.7–4.0)
MCH: 25.9 pg — ABNORMAL LOW (ref 26.0–34.0)
MCHC: 29.2 g/dL — ABNORMAL LOW (ref 30.0–36.0)
MCV: 88.8 fL (ref 80.0–100.0)
Monocytes Absolute: 0.3 K/uL (ref 0.1–1.0)
Monocytes Relative: 7 %
Neutro Abs: 2.1 K/uL (ref 1.7–7.7)
Neutrophils Relative %: 52 %
Platelet Count: 325 K/uL (ref 150–400)
RBC: 3.47 MIL/uL — ABNORMAL LOW (ref 3.87–5.11)
RDW: 25.2 % — ABNORMAL HIGH (ref 11.5–15.5)
WBC Count: 4.1 K/uL (ref 4.0–10.5)
nRBC: 0 % (ref 0.0–0.2)

## 2024-05-29 LAB — SAMPLE TO BLOOD BANK

## 2024-05-29 MED ORDER — SODIUM CHLORIDE 0.9% FLUSH
10.0000 mL | Freq: Once | INTRAVENOUS | Status: AC | PRN
  Administered 2024-05-29: 10 mL
  Filled 2024-05-29: qty 10

## 2024-05-29 MED ORDER — IRON SUCROSE 20 MG/ML IV SOLN
200.0000 mg | Freq: Once | INTRAVENOUS | Status: AC
  Administered 2024-05-29: 200 mg via INTRAVENOUS
  Filled 2024-05-29: qty 10

## 2024-05-29 NOTE — Progress Notes (Unsigned)
 [facility] CONSULT NOTE  Patient Care Team: Tobie Domino, MD as PCP - General (Family Medicine) Cindie Jesusa HERO, RN as Registered Nurse Dannielle Arlean FALCON, RN (Inactive) as Registered Nurse Rennie Cindy SAUNDERS, MD as Consulting Physician (Oncology)  CHIEF COMPLAINTS/PURPOSE OF CONSULTATION: Anemia  # AUG 2019-anemia 7-8;  MCV 80; electrophoresis-normal/PCP; ferritin 15; creat 0.88; LFTs-N; LDH- N;   #Hepatitis C positive [HCV >11]/ B-12/ folate; active smoker  #Active smoker. Oncology History   No history exists.     HISTORY OF PRESENTING ILLNESS: Patient ambulating-independently. Alone   Apolinar FORBES Sar 58 y.o.  female with no significant past medical history except for untreated hep C/active smoker-history of microcytic anemia-unclear etiology is here for follow-up.  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  At that time she was hospitalized for symptomatic anemia and required 2 units of packed red blood cells.  She has not follows up with GI since.    Patient continues to complain of worsening fatigue.  Reports black tarry stools today.  Complains of poor appetite.  Complains of shortness of breath on exertion with intermittent dizziness.   No weight loss.  On her blood work today we found her hemoglobin was down to 7.  We discussed the plan of care in detail.  She is in agreement with proceeding with 1 unit of packed red blood cells tomorrow.  Iron  transfusions weekly for the next 5 weeks starting Thursday.  As well as following up as soon as possible with GI to attempt to find the source of blood loss.  She verbalizes understanding.  I did speak with [facility] they are going to work her in this week for an appointment.  Review of Systems  Constitutional:  Positive for malaise/fatigue. Negative for chills, diaphoresis, fever and weight loss.  HENT:  Negative for nosebleeds and sore throat.   Eyes:  Negative for double vision.   Respiratory:  Negative for cough, hemoptysis, sputum production, shortness of breath and wheezing.   Cardiovascular:  Negative for chest pain, palpitations, orthopnea and leg swelling.  Gastrointestinal:  Negative for abdominal pain, blood in stool, constipation, diarrhea, heartburn, melena, nausea and vomiting.  Genitourinary:  Negative for dysuria, frequency and urgency.  Musculoskeletal:  Positive for back pain and myalgias. Negative for joint pain.  Skin: Negative.  Negative for itching and rash.  Neurological:  Negative for dizziness, tingling, focal weakness, weakness and headaches.  Endo/Heme/Allergies:  Does not bruise/bleed easily.  Psychiatric/Behavioral:  Negative for depression. The patient is not nervous/anxious and does not have insomnia.      MEDICAL HISTORY:  Past Medical History:  Diagnosis Date  . Atopic dermatitis   . Constipation   . Depression   . GERD (gastroesophageal reflux disease)   . Hepatitis C   . Microcytic anemia   . Sciatica   . Tobacco use     SURGICAL HISTORY: Past Surgical History:  Procedure Laterality Date  . ESOPHAGOGASTRODUODENOSCOPY N/A 10/24/2020   Procedure: ESOPHAGOGASTRODUODENOSCOPY (EGD);  Surgeon: Janalyn Keene NOVAK, MD;  Location: [facility] ENDOSCOPY;  Service: Endoscopy;  Laterality: N/A;  . paraguard IUD surgery  05/22/2002    SOCIAL HISTORY: Social History   Socioeconomic History  . Marital status: Single    Spouse name: Not on file  . Number of children: Not on file  . Years of education: Not on file  . Highest education level: Not on file  Occupational History  . Not  on file  Tobacco Use  . Smoking status: Every Day    Types: Cigarettes  . Smokeless tobacco: Never  . Tobacco comments:    5 cigerattes per day  Vaping Use  . Vaping status: Never Used  Substance and Sexual Activity  . Alcohol use: No  . Drug use: Never  . Sexual activity: Not on file  Other Topics Concern  . Not on file  Social History Narrative    In health care; alochol/beer every other day; smoke 5 cig/day; in [place]   Social Drivers of Health   Financial Resource Strain: Medium Risk (05/10/2024)   Received from [facility]   Overall Financial Resource Strain (CARDIA)   . Difficulty of Paying Living Expenses: Somewhat hard  Food Insecurity: No Food Insecurity (05/10/2024)   Received from [facility]   Hunger Vital Sign   . Within the past 12 months, you worried that your food would run out before you got the money to buy more.: Never true   . Within the past 12 months, the food you bought just didn't last and you didn't have money to get more.: Never true  Transportation Needs: No Transportation Needs (05/10/2024)   Received from [facility]   [facility] - Transportation   . In the past 12 months, has lack of transportation kept you from medical appointments or from getting medications?: No   . Lack of Transportation (Non-Medical): No  Physical Activity: Not on file  Stress: Not on file  Social Connections: Not on file  Intimate Partner Violence: Not At Risk (03/26/2024)   Humiliation, Afraid, Rape, and Kick questionnaire   . Fear of Current or Ex-Partner: No   . Emotionally Abused: No   . Physically Abused: No   . Sexually Abused: No    FAMILY HISTORY: Family History  Problem Relation Age of Onset  . Stomach cancer Mother 79  . Cervical cancer Mother   . Cirrhosis Father   . Breast cancer Sister 24  . Throat cancer Other     ALLERGIES:  is allergic to codeine and percocet [oxycodone-acetaminophen ].  MEDICATIONS:  Current Outpatient Medications  Medication Sig Dispense Refill  . Alum & Mag Hydroxide-Simeth (MAALOX ADVANCED PO) Take 1 tablet by mouth 3 times/day as needed-between meals & bedtime (gi distress).     . gabapentin  (NEURONTIN ) 100 MG capsule Take 1 capsule (100 mg total) by mouth 2 (two) times daily. 60 capsule 0  . traMADol  (ULTRAM ) 50 MG tablet  Take 1 tablet (50 mg total) by mouth every 6 (six) hours as needed for severe pain. 12 tablet 0   No current facility-administered medications for this visit.      SABRA  PHYSICAL EXAMINATION: ECOG PERFORMANCE STATUS: 1 - Symptomatic but completely ambulatory  Vitals:   05/29/24 0934 05/29/24 1002  BP: (!) 142/71 (!) 151/69  Pulse: 66   Temp: (!) 97 F (36.1 C)   SpO2: 96%     Filed Weights   05/29/24 0934  Weight: 223 lb (101.2 kg)     Physical Exam HENT:     Head: Normocephalic and atraumatic.     Mouth/Throat:     Pharynx: No oropharyngeal exudate.  Eyes:     Pupils: Pupils are equal, round, and reactive to light.  Cardiovascular:     Rate and Rhythm: Normal rate and regular rhythm.  Pulmonary:     Effort: No respiratory distress.     Breath sounds: No wheezing.  Abdominal:     General: Bowel sounds are normal. There is no distension.     Palpations: Abdomen is soft. There is no mass.     Tenderness: There is no abdominal tenderness. There is no guarding or rebound.  Musculoskeletal:        General: No tenderness. Normal range of motion.     Cervical back: Normal range of motion and neck supple.  Skin:    General: Skin is warm.  Neurological:     Mental Status: She is alert and oriented to person, place, and time.  Psychiatric:        Mood and Affect: Affect normal.      LABORATORY DATA:  I have reviewed the data as listed Lab Results  Component Value Date   WBC 4.1 05/29/2024   HGB 9.0 (L) 05/29/2024   HCT 30.8 (L) 05/29/2024   MCV 88.8 05/29/2024   PLT 325 05/29/2024   Recent Labs    05/08/24 1003 05/15/24 0912 05/29/24 0934  NA 139 141 146*  K 3.6 3.8 3.6  CL 108 111 115*  CO2 22 21* 20*  GLUCOSE 139* 110* 174*  BUN 8 11 11   CREATININE 0.79 0.87 0.87  CALCIUM 8.5* 8.4* 8.7*  GFRNONAA >60 >60 >60  PROT  --  7.3 6.5  ALBUMIN  --  3.0* 3.4*  AST  --  37 34  ALT  --  25 26  ALKPHOS  --  75 78  BILITOT  --  0.7 0.3     RADIOGRAPHIC  STUDIES: I have personally reviewed the radiological images as listed and agreed with the findings in the report. No results found.   ASSESSMENT & PLAN:   Symptomatic anemia -Anemia hemoglobin 7-8; MCV 80- [chronic]- [SEP 2025- PCP- ferritin-13; I sat- 6].  Poor tolerance to oral iron . Hg 7 today she is symptomatic with shortness of breath, fatigue, and dizziness  - Discussed the plan to receive 1 unit of packed red blood cells tomorrow at 8:30 AM here in infusion.  She verbalizes understanding.  She understands the risk.   -Discussed the potential acute infusion reactions with IV iron ; which are quite rare.  Patient understands the risk; will proceed with infusions starting Thursday   - 2022- Gastric wall thickening-noted on the CT of the abdomen pelvis/also mild periportal adenopathy-recommend EGD for further evaluation.  Recommend GI evaluation urgently.  Menopause more than 15 years ago.  I messaged [facility] working on getting an urgent appointment as I suspect that this is the source of her anemia.  Spoke with [facility] working her in this week urgent referral sent.   -Hepatitis C untreated-await GI evaluation.  F/U Plan: 1 unit pRBC tomorrow Type and screen today Return this week preferably Thursday for cbc with diff and venofer  F/U with MD/NP next week cbc with D/CMP/hold tube possible blood or venofer   F/u with GI as soon as possible LP     All questions were answered. The patient knows to call the clinic with any problems, questions or concerns.    Morna Husband, NP 05/29/2024 12:47 PM

## 2024-05-30 ENCOUNTER — Encounter: Payer: Self-pay | Admitting: Internal Medicine

## 2024-06-06 DIAGNOSIS — D649 Anemia, unspecified: Secondary | ICD-10-CM

## 2024-06-07 ENCOUNTER — Inpatient Hospital Stay

## 2024-06-07 VITALS — BP 120/66 | HR 78 | Temp 96.4°F | Resp 16

## 2024-06-07 DIAGNOSIS — D649 Anemia, unspecified: Secondary | ICD-10-CM

## 2024-06-07 DIAGNOSIS — D509 Iron deficiency anemia, unspecified: Secondary | ICD-10-CM | POA: Diagnosis not present

## 2024-06-07 LAB — CBC WITH DIFFERENTIAL (CANCER CENTER ONLY)
Abs Immature Granulocytes: 0.01 K/uL (ref 0.00–0.07)
Basophils Absolute: 0 K/uL (ref 0.0–0.1)
Basophils Relative: 1 %
Eosinophils Absolute: 0.2 K/uL (ref 0.0–0.5)
Eosinophils Relative: 4 %
HCT: 35.5 % — ABNORMAL LOW (ref 36.0–46.0)
Hemoglobin: 10.7 g/dL — ABNORMAL LOW (ref 12.0–15.0)
Immature Granulocytes: 0 %
Lymphocytes Relative: 38 %
Lymphs Abs: 1.5 K/uL (ref 0.7–4.0)
MCH: 27.5 pg (ref 26.0–34.0)
MCHC: 30.1 g/dL (ref 30.0–36.0)
MCV: 91.3 fL (ref 80.0–100.0)
Monocytes Absolute: 0.3 K/uL (ref 0.1–1.0)
Monocytes Relative: 8 %
Neutro Abs: 2 K/uL (ref 1.7–7.7)
Neutrophils Relative %: 49 %
Platelet Count: 244 K/uL (ref 150–400)
RBC: 3.89 MIL/uL (ref 3.87–5.11)
RDW: 26.4 % — ABNORMAL HIGH (ref 11.5–15.5)
WBC Count: 4 K/uL (ref 4.0–10.5)
nRBC: 0 % (ref 0.0–0.2)

## 2024-06-07 MED ORDER — IRON SUCROSE 20 MG/ML IV SOLN
200.0000 mg | Freq: Once | INTRAVENOUS | Status: AC
  Administered 2024-06-07: 11:00:00 200 mg via INTRAVENOUS
  Filled 2024-06-07: qty 10

## 2024-06-07 NOTE — Patient Instructions (Signed)

## 2024-06-25 DIAGNOSIS — D649 Anemia, unspecified: Secondary | ICD-10-CM

## 2024-06-26 ENCOUNTER — Inpatient Hospital Stay: Attending: Internal Medicine

## 2024-06-26 ENCOUNTER — Ambulatory Visit

## 2024-06-26 ENCOUNTER — Ambulatory Visit: Admitting: Internal Medicine

## 2024-06-26 DIAGNOSIS — D500 Iron deficiency anemia secondary to blood loss (chronic): Secondary | ICD-10-CM

## 2024-06-26 DIAGNOSIS — D649 Anemia, unspecified: Secondary | ICD-10-CM

## 2024-06-26 LAB — CBC WITH DIFFERENTIAL (CANCER CENTER ONLY)
Abs Immature Granulocytes: 0.01 K/uL (ref 0.00–0.07)
Basophils Absolute: 0 K/uL (ref 0.0–0.1)
Basophils Relative: 1 %
Eosinophils Absolute: 0.1 K/uL (ref 0.0–0.5)
Eosinophils Relative: 3 %
HCT: 34.3 % — ABNORMAL LOW (ref 36.0–46.0)
Hemoglobin: 10.5 g/dL — ABNORMAL LOW (ref 12.0–15.0)
Immature Granulocytes: 0 %
Lymphocytes Relative: 24 %
Lymphs Abs: 0.9 K/uL (ref 0.7–4.0)
MCH: 29.2 pg (ref 26.0–34.0)
MCHC: 30.6 g/dL (ref 30.0–36.0)
MCV: 95.5 fL (ref 80.0–100.0)
Monocytes Absolute: 0.3 K/uL (ref 0.1–1.0)
Monocytes Relative: 7 %
Neutro Abs: 2.4 K/uL (ref 1.7–7.7)
Neutrophils Relative %: 65 %
Platelet Count: 318 K/uL (ref 150–400)
RBC: 3.59 MIL/uL — ABNORMAL LOW (ref 3.87–5.11)
RDW: 25 % — ABNORMAL HIGH (ref 11.5–15.5)
WBC Count: 3.6 K/uL — ABNORMAL LOW (ref 4.0–10.5)
nRBC: 0 % (ref 0.0–0.2)

## 2024-06-26 LAB — SAMPLE TO BLOOD BANK

## 2024-06-26 LAB — TSH: TSH: 0.701 u[IU]/mL (ref 0.350–4.500)

## 2024-06-26 LAB — FERRITIN: Ferritin: 81 ng/mL (ref 11–307)

## 2024-06-26 LAB — VITAMIN B12: Vitamin B-12: 444 pg/mL (ref 180–914)

## 2024-06-26 LAB — FOLATE: Folate: 6.2 ng/mL

## 2024-06-26 LAB — IRON AND TIBC
Iron: 47 ug/dL (ref 28–170)
Saturation Ratios: 12 % (ref 10.4–31.8)
TIBC: 403 ug/dL (ref 250–450)
UIBC: 357 ug/dL

## 2024-06-27 ENCOUNTER — Other Ambulatory Visit: Payer: Self-pay | Admitting: *Deleted

## 2024-06-27 ENCOUNTER — Inpatient Hospital Stay

## 2024-06-27 ENCOUNTER — Telehealth: Payer: Self-pay

## 2024-06-27 ENCOUNTER — Inpatient Hospital Stay (HOSPITAL_BASED_OUTPATIENT_CLINIC_OR_DEPARTMENT_OTHER): Admitting: Nurse Practitioner

## 2024-06-27 VITALS — BP 129/69 | HR 80 | Temp 97.8°F | Resp 20 | Wt 220.3 lb

## 2024-06-27 VITALS — BP 126/79 | HR 79

## 2024-06-27 DIAGNOSIS — D500 Iron deficiency anemia secondary to blood loss (chronic): Secondary | ICD-10-CM

## 2024-06-27 DIAGNOSIS — D649 Anemia, unspecified: Secondary | ICD-10-CM

## 2024-06-27 MED ORDER — SODIUM CHLORIDE 0.9% FLUSH
10.0000 mL | Freq: Once | INTRAVENOUS | Status: AC | PRN
  Administered 2024-06-27: 10 mL
  Filled 2024-06-27: qty 10

## 2024-06-27 MED ORDER — IRON SUCROSE 20 MG/ML IV SOLN
200.0000 mg | Freq: Once | INTRAVENOUS | Status: AC
  Administered 2024-06-27: 200 mg via INTRAVENOUS
  Filled 2024-06-27: qty 10

## 2024-06-27 NOTE — Telephone Encounter (Signed)
 Called placed to [facility] to get patient r/s for colonoscopy. Per [place] with scheduling states she will send Nurse a message to get her scheduled. Patient informed that [facility] will be in contact to get that scheduled. Patient verbalized understanding.

## 2024-06-27 NOTE — Progress Notes (Signed)
 [facility] CONSULT NOTE  Patient Care Team: Tobie Domino, MD as PCP - General (Family Medicine) Cindie Jesusa HERO, RN as Registered Nurse Dannielle Arlean FALCON, RN (Inactive) as Registered Nurse Rennie Cindy SAUNDERS, MD as Consulting Physician (Oncology)  CHIEF COMPLAINTS/PURPOSE OF CONSULTATION: Anemia  # AUG 2019-anemia 7-8;  MCV 80; electrophoresis-normal/PCP; ferritin 15; creat 0.88; LFTs-N; LDH- N;   #Hepatitis C positive [HCV >11]/ B-12/ folate; active smoker  #Active smoker. Oncology History   No problem history exists.     HISTORY OF PRESENTING ILLNESS: Patient ambulating-independently. Alone   Morgan Hicks 59 y.o.  female with no significant past medical history except for untreated hep C/active smoker-history of microcytic anemia-unclear etiology is here for follow-up.  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  At that time she was hospitalized for symptomatic anemia and required 2 units of packed red blood cells.  Patient presented to clinic for follow up last month with hg 7 symptomatic with shortness of breath and fatigue.  At that time patient was reporting dark tarry stools.  She did receive 1 unit pRBC on 05/09/24.  She also was worked in with GI that week as well.  She was set up for Colonoscopy/endoscopy for 05/22/24.  Patient canceled appointment for scope due to unexpected death in her family.    She has received 5 venofer  infusions and tolerated them well.  She reports improvement in shortness of breath but does continue to feel fatigue.  Hg has improved overall however starting to slowly trend back down.  I suspect a slow bleed.  She is in agreement to have colonoscopy rescheduled today.  Nursing staff to reach out to GI and inquire about scheduling.  In the mean time we will plan to support her with more venofer  and closely monitor Hg as she has a history of hospitalization for symptomatic anemia requiring blood  transfusion.      Review of Systems  Constitutional:  Positive for malaise/fatigue. Negative for chills, diaphoresis, fever and weight loss.  HENT:  Negative for nosebleeds and sore throat.   Eyes:  Negative for double vision.  Respiratory:  Negative for cough, hemoptysis, sputum production, shortness of breath and wheezing.   Cardiovascular:  Negative for chest pain, palpitations, orthopnea and leg swelling.  Gastrointestinal:  Negative for abdominal pain, blood in stool, constipation, diarrhea, heartburn, melena, nausea and vomiting.  Genitourinary:  Negative for dysuria, frequency and urgency.  Musculoskeletal:  Positive for back pain and myalgias. Negative for joint pain.  Skin: Negative.  Negative for itching and rash.  Neurological:  Negative for dizziness, tingling, focal weakness, weakness and headaches.  Endo/Heme/Allergies:  Does not bruise/bleed easily.  Psychiatric/Behavioral:  Negative for depression. The patient is not nervous/anxious and does not have insomnia.      MEDICAL HISTORY:  Past Medical History:  Diagnosis Date   Atopic dermatitis    Constipation    Depression    GERD (gastroesophageal reflux disease)    Hepatitis C    Microcytic anemia    Sciatica    Tobacco use     SURGICAL HISTORY: Past Surgical History:  Procedure Laterality Date   ESOPHAGOGASTRODUODENOSCOPY N/A 10/24/2020   Procedure: ESOPHAGOGASTRODUODENOSCOPY (EGD);  Surgeon: Janalyn Keene NOVAK, MD;  Location: [facility] ENDOSCOPY;  Service: Endoscopy;  Laterality: N/A;   paraguard IUD surgery  05/22/2002    SOCIAL HISTORY: Social History   Socioeconomic History   Marital status: Single  Spouse name: Not on file   Number of children: Not on file   Years of education: Not on file   Highest education level: Not on file  Occupational History   Not on file  Tobacco Use   Smoking status: Every Day    Types: Cigarettes   Smokeless tobacco: Never   Tobacco comments:    5 cigerattes per day   Vaping Use   Vaping status: Never Used  Substance and Sexual Activity   Alcohol use: No   Drug use: Never   Sexual activity: Not on file  Other Topics Concern   Not on file  Social History Narrative   In health care; alochol/beer every other day; smoke 5 cig/day; in [place]   Social Drivers of Health   Tobacco Use: High Risk (05/29/2024)   Patient History    Smoking Tobacco Use: Every Day    Smokeless Tobacco Use: Never    Passive Exposure: Not on file  Financial Resource Strain: Medium Risk (05/10/2024)   Received from [facility]   Overall Financial Resource Strain (CARDIA)    Difficulty of Paying Living Expenses: Somewhat hard  Food Insecurity: No Food Insecurity (05/10/2024)   Received from [facility]   Epic    Within the past 12 months, you worried that your food would run out before you got the money to buy more.: Never true    Within the past 12 months, the food you bought just didn't last and you didn't have money to get more.: Never true  Transportation Needs: No Transportation Needs (05/10/2024)   Received from [facility] - Transportation    In the past 12 months, has lack of transportation kept you from medical appointments or from getting medications?: No    Lack of Transportation (Non-Medical): No  Physical Activity: Not on file  Stress: Not on file  Social Connections: Not on file  Intimate Partner Violence: Not At Risk (03/26/2024)   Epic    Fear of Current or Ex-Partner: No    Emotionally Abused: No    Physically Abused: No    Sexually Abused: No  Depression (PHQ2-9): Low Risk (06/27/2024)   Depression (PHQ2-9)    PHQ-2 Score: 0  Alcohol Screen: Not on file  Housing: Low Risk  (05/10/2024)   Received from [facility]   Epic    In the last 12 months, was there a time when you were not able to pay the mortgage or rent on time?: No    In the past 12 months, how many times  have you moved where you were living?: 0    At any time in the past 12 months, were you homeless or living in a shelter (including now)?: No  Utilities: At Risk (05/10/2024)   Received from [facility]   Epic    In the past 12 months has the electric, gas, oil, or water company threatened to shut off services in your home?: Yes  Health Literacy: Not on file    FAMILY HISTORY: Family History  Problem Relation Age of Onset   Stomach cancer Mother 74   Cervical cancer Mother    Cirrhosis Father    Breast cancer Sister 41   Throat cancer Other     ALLERGIES:  is allergic to codeine and percocet [oxycodone-acetaminophen ].  MEDICATIONS:  Current Outpatient Medications  Medication Sig Dispense Refill   Alum & Mag Hydroxide-Simeth (MAALOX ADVANCED PO)  Take 1 tablet by mouth 3 times/day as needed-between meals & bedtime (gi distress).      gabapentin  (NEURONTIN ) 100 MG capsule Take 1 capsule (100 mg total) by mouth 2 (two) times daily. 60 capsule 0   traMADol  (ULTRAM ) 50 MG tablet Take 1 tablet (50 mg total) by mouth every 6 (six) hours as needed for severe pain. 12 tablet 0   No current facility-administered medications for this visit.      Lab Results  Component Value Date   IRON  47 06/26/2024   TIBC 403 06/26/2024   FERRITIN 81 06/26/2024   Last CBC Lab Results  Component Value Date   WBC 3.6 (L) 06/26/2024   HGB 10.5 (L) 06/26/2024   HCT 34.3 (L) 06/26/2024   MCV 95.5 06/26/2024   MCH 29.2 06/26/2024   RDW 25.0 (H) 06/26/2024   PLT 318 06/26/2024    Lab Results  Component Value Date   NA 146 (H) 05/29/2024   K 3.6 05/29/2024   CO2 20 (L) 05/29/2024   GLUCOSE 174 (H) 05/29/2024   BUN 11 05/29/2024   CREATININE 0.87 05/29/2024   CALCIUM 8.7 (L) 05/29/2024   GFRNONAA >60 05/29/2024    .  PHYSICAL EXAMINATION: ECOG PERFORMANCE STATUS: 1 - Symptomatic but completely ambulatory  Vitals:   06/27/24 1054  BP: 129/69  Pulse: 80  Resp: 20  Temp:  97.8 F (36.6 C)  SpO2: 98%     Filed Weights   06/27/24 1054  Weight: 220 lb 4.8 oz (99.9 kg)      Physical Exam HENT:     Head: Normocephalic and atraumatic.     Mouth/Throat:     Pharynx: No oropharyngeal exudate.  Eyes:     Pupils: Pupils are equal, round, and reactive to light.  Cardiovascular:     Rate and Rhythm: Normal rate and regular rhythm.  Pulmonary:     Effort: No respiratory distress.     Breath sounds: No wheezing.  Abdominal:     General: Bowel sounds are normal. There is no distension.     Palpations: Abdomen is soft. There is no mass.     Tenderness: There is no abdominal tenderness. There is no guarding or rebound.  Musculoskeletal:        General: No tenderness. Normal range of motion.     Cervical back: Normal range of motion and neck supple.  Skin:    General: Skin is warm.  Neurological:     Mental Status: She is alert and oriented to person, place, and time.  Psychiatric:        Mood and Affect: Affect normal.      LABORATORY DATA:  I have reviewed the data as listed Lab Results  Component Value Date   WBC 3.6 (L) 06/26/2024   HGB 10.5 (L) 06/26/2024   HCT 34.3 (L) 06/26/2024   MCV 95.5 06/26/2024   PLT 318 06/26/2024   Recent Labs    05/08/24 1003 05/15/24 0912 05/29/24 0934  NA 139 141 146*  K 3.6 3.8 3.6  CL 108 111 115*  CO2 22 21* 20*  GLUCOSE 139* 110* 174*  BUN 8 11 11   CREATININE 0.79 0.87 0.87  CALCIUM 8.5* 8.4* 8.7*  GFRNONAA >60 >60 >60  PROT  --  7.3 6.5  ALBUMIN  --  3.0* 3.4*  AST  --  37 34  ALT  --  25 26  ALKPHOS  --  75 78  BILITOT  --  0.7 0.3  Iron /TIBC/Ferritin/ %Sat       Component Ref Range & Units (hover) 2 d ago 1 mo ago 3 yr ago 6 yr ago  Iron  47 15 Low  31 439 High   TIBC 403 517 High  531 High  482 High   Saturation Ratios 12 3 Low  6 Low  91 High   UIBC 357 502         RADIOGRAPHIC STUDIES: I have personally reviewed the radiological images as listed and agreed with the findings  in the report. No results found.   ASSESSMENT & PLAN:   Symptomatic anemia -Anemia hemoglobin 7-8; MCV 80- [chronic]- [SEP 2025- PCP- ferritin-13; I sat- 6].  Poor tolerance to oral iron . Hg 10.5 today she is symptomatic with fatigue, reports improvement with shortness of breath and dizziness.  Proceed with Venofer  today.    -Discussed the potential acute infusion reactions with IV iron ; which are quite rare.  Patient understands the risk; will proceed with infusions starting Thursday   - 2022- Gastric wall thickening-noted on the CT of the abdomen pelvis/also mild periportal adenopathy-recommend EGD for further evaluation.  Recommend GI evaluation urgently.  Menopause more than 15 years ago.  I messaged [facility] working on getting an urgent appointment as I suspect that this is the source of her anemia.  Seen GI and was scheduled for colonoscopy/endoscopy for last week but canceled due to family emergency.  She is in agreement to have colonoscopy/endoscopy rescheduled today nursing staff to reach out and schedule for soonest appointment.   -Hepatitis C untreated-Followed by GI  F/U Plan: F/U with GI for colonoscopy/endoscopy Venofer  today F/U 1 week for venofer  F/U in 6 weeks labs cbc with diff, ferritin, Iron  and TIBC, hold tube D1, see np/md possible blood or venofer  day 2 LP      All questions were answered. The patient knows to call the clinic with any problems, questions or concerns.    Morgan Husband, NP 06/28/2024 8:57 AM

## 2024-06-28 ENCOUNTER — Encounter: Payer: Self-pay | Admitting: Internal Medicine

## 2024-07-09 ENCOUNTER — Encounter: Payer: Self-pay | Admitting: Internal Medicine

## 2024-07-18 ENCOUNTER — Ambulatory Visit: Admission: RE | Admit: 2024-07-18 | Source: Home / Self Care | Admitting: Internal Medicine

## 2024-07-18 ENCOUNTER — Encounter: Admission: RE | Payer: Self-pay | Source: Home / Self Care

## 2024-08-07 ENCOUNTER — Inpatient Hospital Stay

## 2024-08-07 ENCOUNTER — Inpatient Hospital Stay: Admitting: Nurse Practitioner

## 2024-08-08 ENCOUNTER — Inpatient Hospital Stay

## 2024-08-08 ENCOUNTER — Inpatient Hospital Stay: Admitting: Nurse Practitioner
